# Patient Record
Sex: Female | Born: 1970 | Race: Black or African American | Hispanic: No | Marital: Single
Health system: Midwestern US, Academic
[De-identification: ages and names within clinical notes are randomized; demographics above are authoritative.]

## PROBLEM LIST (undated history)

## (undated) HISTORY — PX: CHOLECYSTECTOMY: SHX55

---

## 2007-11-01 ENCOUNTER — Inpatient Hospital Stay: Primary: Student in an Organized Health Care Education/Training Program

## 2007-12-24 ENCOUNTER — Inpatient Hospital Stay: Primary: Student in an Organized Health Care Education/Training Program

## 2008-03-20 NOTE — Unmapped (Signed)
Signed by Ranae Plumber MD on 06/28/2008 at 10:25:47  Pap Smear      Imported By: Emilie Rutter MA 06/24/2008 13:06:54    _____________________________________________________________________    External Attachment:    Please see [facility] EMR for this document.

## 2008-05-17 ENCOUNTER — Inpatient Hospital Stay: Primary: Student in an Organized Health Care Education/Training Program

## 2008-05-17 LAB — OFFICE VISIT LAB RESULTS: KOH Prep: POSITIVE

## 2008-05-17 NOTE — Unmapped (Signed)
Signed by Marlow Baars [name] on 05/17/2008 at 11:55:08    Demographic Update       Reason for Visit   Chief Complaint: Annual  Pap and exam.  History from: patient    Allergies  No Known Allergies  Blood Pressure   BP #1: 142 / Hg  Cuff Size: Lg   BP #2: 140 / 100 mm Hg Cuff Size: Lg     Pain:     Have you had pain other than everyday aches and pains (e.g., mild headache, back ache, strains) in the past week?   No    Intake recorded by: April Schachleiter CMA on May 17, 2008 10:14 AM    Comments: pt sts has not been to any Dr. in 13 years.             History of Present Illness   Age of First Menses: 13  LMP: 05/08/2008  History of Abnormal Paps: No  G: 3  P 3:     # Living Children: 3  Current Birth Control: BTL  Other Birth Control: BLT 01/02/1990    c/o vag discharge and fishy odor      Annual Visit     Current Problems   Patient complains of: vaginal discharge  Patient denies: abnormal bleeding, breast problems, dysmenorrhea, pelvic pain, vulvar pain, sexual problems    Currently Sexually Active: Yes  Partners in Last 12 Months: 1 x 3 yrs  Calcium Supplement: No  Vitamin D Supplement: No  Multivitamin: No  Family History of Osteoporosis: No  Exercise: Yes  Type: walks alot  Has Patient been threatened, abused or hurt by anyone? No    Past History  Past Medical History:  Onset of First Menses at Age: 44, Gravida: 3, Para: 3, # Vaginal Deliveries: 2  Surgical History:  Cesarean Section: 1991, Tubal Ligation: 1991    Family History: Father - NIDDM  PGM - IDDM  Social History: Alcohol Use: none  Drug Use: none  Tobacco Usage:non-smoker  Sexually Active: Yes        Review of Systems  Genitourinary: Complains of vaginal discharge.     Physical Examination  Constitutional: alert, no acute distress, well hydrated, well developed, well nourished;    Skin: normal color, no rashes, no lesions, warm to touch;    Head: atraumatic, normocephalic;    Eyes: pupils equal, no injection, no icterus;    Ears/Nose/Throat:  external ears normal, hearing normal, external nose normal;    Neck: supple, no adenopathy, no masses, no thyromegaly;    Chest: no apparent respiratory distress, normal chest inspection, clear to auscultation, normal breath sounds bilaterally;    Breast:  breast symmetrical, no tenderness, no dimpling, no masses, no erythema, no axillary adenopathy, normal aerola bilaterally, no nipple discharge, no inversion;    Cardiac:  normal S1 and S2, regular rate and rhythm, no murmurs, no gallops, no rubs;    Abdomen: nondistended, nontender, no guarding, no masses, no organomegaly, no abdominal hernias, no suprapubic tenderness;    External Genitalia:  no lesions, normal estrogen effect, normal hair pattern, no clitorimegaly, no erythema, normal perineal body;    Urethra:  normal appearance, no erythema, no tenderness, normal urethral meatus;    Vagina:  no lesions, no masses, no cystocele, no rectocele, normal support, normal rugations;  discharge;  yellow malodorous.    Cervix:  present, no lesions, no discharge, no cervical motion tenderness;  friable;    Uterus:  uterus midline, normal  size, non-tender, regular contour, mobile;    Adnexa:  no masses, no tenderness;    Lymphatic: no cervical adenopathy, no supraclavicular adenopathy, no axillary adenopathy, no inguinal adenopathy, no femoral adenopathy, no enlargement;    Back: no deformities, normal spine, no cva tenderness;    Extremities: no edema, no lesions;    Neuro: normal mental status;    Psych: oriented to time; place; and person, affect and mood appropriate;        Wet Mount/KOH   Source: vaginal  WM: Clue Cells present  KOH: Positive Whiff      Assessment   37 year old Female     G: 3  P 3:     # Living Children: 3    Status of Existing Problems  Assessed ANNUAL GYNECOLOGICAL EXAMINATION as comment only - well woman annual exam  BSE monthly  Pap/DNAP done  Flagyl twice a day x 7 days for BV  Rec pt find a PCP for blood pressure f/u----pt states she'll call  [facility]  RTO 1 yr - Marlow Baars [name]    Medications   New Prescriptions/Refills:  METRONIDAZOLE 500 MG TABS (METRONIDAZOLE) one by mouth twice a day for seven days  #14 x 0, 05/17/2008, Marlow Baars [name]      Today's Orders   Cytology, Pap / HPV Reflex / GC / Chlamydia (ThinPrep) ([id number]) [[id number]]    Prescriptions:  METRONIDAZOLE 500 MG TABS (METRONIDAZOLE) one by mouth twice a day for seven days  #14 x 0   Entered and Authorized by: Marlow Baars [name]   Signed by: Marlow Baars [name] on 05/17/2008   Method used: Print then Give to Patient   RxID: [id number]            ]    NURSE EXIT NOTES   No RN exit.  Instructions per NP  ...................................................................[facility] RN  May 17, 2008 11:19 AM

## 2008-06-26 NOTE — Unmapped (Signed)
Signed by Audie Box RN on 06/26/2008 at 12:49:56      Follow-up for Test Results:   Test results are: abnormal  Comments: Positive trichomonas  Metronidazole 2 gr po x 1  Partner needs treatment  Condoms 100%  Rec. HIV/RPR/Hep B + C  No intercourse x 7 days after both are treated    Positive chlamydia culture  Zithromax 1 gr po x 1  Partner needs tx.  No intercourse x 7 days after both are treated  Condoms 100%  Rec. RPR, HIV, Hep B + C  Rec. recheck in 1-2 mths    Action taken: call patient to inform of results  Follow-up by: Marlow Baars [name],  June 26, 2008 10:25 AM    Additional Follow-up for Test Results:   Comments: Pt informed of positive chlamydia culture and trich on Pap. NKDA  metronidazole 2gram by mouth x1 and Zithromax 1 gram by mouth x1 phoned to [facility] at [phone]  Pt offerred toc in 4-6 weeks  Additional Follow-up by: Audie Box RN,  June 26, 2008 12:48 PM    New Problems:  CHLAMYDIA TRACHOMATIS, GU SITE NEC (ICD-099.54)  TRICHOMONIASIS UNSPECIFIED (ICD-131.00)  New Medications:  * METRONIDAZOLE 500 MG TABS (METRONIDAZOLE)  #4 take all by mouth x 1  ZITHROMAX 1 GM PACK (AZITHROMYCIN) take all tablets at once

## 2008-06-28 NOTE — Unmapped (Signed)
Signed by Zachery Conch RN on 06/29/2008 at 10:53:46      Follow-up for Test Results:   Comments: Has HGSIL, needs colposcopy  Action taken: call patient to schedule appointment  Follow-up by: Trula Ore MD,  June 28, 2008 10:23 AM    New Problems:  PAP SMER CERV W/HI GRADE SQUAMOUS INTRAEPITH LES (ICD-795.04)    Patient's pap from 05/17/08 was negative with evidence of trichimoniasis. Patient was treated for trich at the time of the visit. Scanning error resulted in the pap of another patient included in EMR of Alexandra [name]. Alexandra [name], Nurse Manager notified of scanning error on 06/26/08.  Zachery Conch, RNC-OB, MSN

## 2008-08-07 ENCOUNTER — Inpatient Hospital Stay: Primary: Student in an Organized Health Care Education/Training Program

## 2008-08-07 LAB — OFFICE VISIT LAB RESULTS
KOH Prep: NEGATIVE
Trichomonas Wet Prep: ABSENT

## 2008-08-07 NOTE — Unmapped (Signed)
Signed by Fredrich Birks MSW LISW-S on 08/07/2008 at 09:52:29    Pt requested bus token home as her transfer ran out.   SW informed her of [employer] transportation benefits, yet pt said they take too long.  SW stated will provide bus token today only as all future transportation is pt's responsibility.  Pt appreciative of help.

## 2008-08-07 NOTE — Unmapped (Signed)
Signed by Aretta Nip NP on 08/07/2008 at 10:10:29    Demographic Update       Reason for Visit   Chief Complaint: pt Here today for  st she has brown discharge and itching.  History from: patient    Allergies  No Known Allergies  Vital Signs  Weight: 187 lbs.     Blood Pressure   BP #1: 116 / 84mm Hg  Cuff Size: Std     Pain:     Have you had pain other than everyday aches and pains (e.g., mild headache, back ache, strains) in the past week?   No    Intake recorded by: April Schachleiter CMA on August 07, 2008 9:10 AM             History of Present Illness   Age of First Menses: 13  LMP: 07/30/2008  History of Abnormal Paps: No  Pap: Normal   Date: 05/22/2008  G: 3  P 3:     # Living Children: 3  Current Birth Control: BTL, Condoms  Other Birth Control: BLT 01/02/1990    Vulvar/Vaginitis   Duration: 14 days  Discharge: Yes  Amount of Discharge: moderate  Consistency: thin  Color: brown  Odor: Yes - odd odor  Pain: No  Skin Changes: No  Itching: Yes  Frequency: sometimes  Location: Periurethra.  Dysuria: No    Associated Symptoms:   Denies: nausea, vomitting, fever, chills, dyspareunia, dysuria, urinary frequency, urinary urgency    Sexual History:   Ever had Sex: No  Currently Sexually Active: Yes  Partners: Men  # Partners in Last Year: 1  New Partner: No  Length of Time with Partner: 3 yrs  Patient denies STD Risk  Frequency of Condom Use: Sometimes  Partner with STD: No  Partner with Symptoms: No    Exposures:   New Products: Sexual Aids              Physical Examination  External Genitalia:  no lesions, normal estrogen effect, normal hair pattern, no clitorimegaly, no erythema, normal perineal body;    Urethra:  normal appearance, no erythema, no tenderness, normal urethral meatus;    Vagina:  no discharge, no lesions, no masses, no cystocele, no rectocele, normal support, normal rugations;    Cervix:  present, no lesions, no cervical motion tenderness;  discharge from os;  brown discharge from the os.       Uterus:  uterus midline, normal size, non-tender, regular contour, mobile;    Adnexa:  no masses, no tenderness;      teaching per NP. ..................................................................Marland KitchenCherlynn June RN  August 07, 2008 10:07 AM          Wet Mount/KOH   Source: vaginal  WM: rare bacteria, none/rare WBCs, Clue Cells present, Trichomonas absent  KOH: No Hyphae, No Budding Yeast, Negative Whiff      Assessment   37 year old Female     G: 3  P 3:     # Living Children: 3    Status of Existing Problems  Assessed BACTERIAL VAGINITIS as comment only - 37 y/o vaginal discharge  brown vaginal discharge with occ itching for 2 wks  tx for chlamydia in [place]  No TOC done  wet mount and DNAP obtained  wet mount +clue cells  RX Metronidazole vaginal gel for 5 days  RTO prn - Aretta Nip NP    Medications   New Prescriptions/Refills:  METRONIDAZOLE 0.75 % VAG GEL (  METRONIDAZOLE) 1 applicator full pv at bedtime for 5 days  #1 x 0, 08/07/2008, Aretta Nip NP      Today's Orders   DNA Probe GC & Chlamydia   (DNAP)  ([id number]) [*[id number]]    Disposition:     *Patient Care Summary printed and given to patient.  Clinic: NP  Primary OB/GYN Provider: Ceasar Lund [name]  prn   Appointment Reason: GYN    Patient Education   Education was provided to: patient  Patient Response: Expressed understanding    Counseling:   HIV Prevention  STD Prevention (Condoms, Abstinence)    Prescriptions:  METRONIDAZOLE 0.75 % VAG GEL (METRONIDAZOLE) 1 applicator full pv at bedtime for 5 days  #1 x 0   Entered and Authorized by: Aretta Nip NP   Signed by: Aretta Nip NP on 08/07/2008   Method used: Print then Give to Patient   RxID: [id number]            ]

## 2008-08-07 NOTE — Unmapped (Signed)
Signed by Aretta Nip NP on 08/08/2008 at 16:28:43  Patient: Alexandra [name]  Note: All result statuses are Final unless otherwise noted.    Tests: (1) CHLAMYDIA/N.GONORRHOEAE DNA, SDA (DNAP)    Order Note: AKA:GC/CHLAMYDIA DNA PROBE:    Order Note: PERFORMED AT: [facility]    Order Note: [employer] [facility]    Order Note: [id number]    Order Note: [place], [place] [id number]    Order Note: LAB DIRECTOR: Dollene Primrose, MD   PHONE: [phone]  ! Chlamydia Trachomatis DNA, SDA                              Negative                    Negative  ! Neisseria Gonorrhoeae DNA, SDA                              Negative                    Negative  ! Please Note               [facility]      Acceptable specimens for this test are female urethral swab,      endocervical swab and liquid based pap specimens, vaginal      swabs in APTIMA transports and first void urine. See online      Directory of Services for test number for rectal and      pharyngeal specimens.    Note: An exclamation mark (!) indicates a result that was not dispersed into   the flowsheet.  Document Creation Date: 08/08/2008 4:15 PM  _______________________________________________________________________    (1) Order result status: Final  Collection or observation date-time: 08/07/2008 12:28  Requested date-time: 08/07/2008 12:28  Receipt date-time: 08/08/2008 00:57  Reported date-time: 08/08/2008 16:15  Referring Physician: Marlow Baars  Ordering Physician: Marlinda Mike ([email or web address])  Specimen Source: SB&SWAB     SWAB CTGC&SWAB(APTIMA Combo 2)RoomTemp  Source: [name] Order Number: [id number]  Lab site: [facility]      [address]      [place] [address]  [phone]

## 2009-09-25 ENCOUNTER — Inpatient Hospital Stay: Primary: Student in an Organized Health Care Education/Training Program

## 2009-09-25 NOTE — Unmapped (Signed)
Signed by Aretta Nip NP on 09/26/2009 at 07:43:43        Reason for Visit   Chief Complaint: Annual exam and pap. c/o milky discharge with odor.  History from: patient    Allergies  No Known Allergies    Vital Signs  Height: 62 in.   Method: per patient  Weight: 191 lbs.     BMI (in-lb): 35.06   BSA (m2): 1.88  Pulse rate: 76   Temperature: 98.2 degrees  F   Blood Pressure   BP #1: 120 / 74mm Hg  Cuff Size: Lg     Pain:     Have you had pain other than everyday aches and pains (e.g., mild headache, back ache, strains) in the past week?   No    Intake recorded by: April Schachleiter CMA on September 25, 2009 2:57 PM             History of Present Illness   Age of First Menses: 13  LMP: 09/03/2009  History of Abnormal Paps: No  Pap: Normal   Date: 05/19/2008  G: 3  P 3:     # Living Children: 3  Current Birth Control: BTL, Condoms  Other Birth Control: BLT 01/03/1995    Annual Visit   Character: normal  Duration of flow: 3-5 day(s)  Interval: 28 days    Current Problems   Patient denies: abnormal bleeding, breast problems, dysmenorrhea, pelvic pain, vaginal discharge, vulvar pain, sexual problems  Additional Details: vaginal discharge with odor for 3wks-milky white    Currently Sexually Active: Yes  Ever Been Sexually Active: Yes  Sexual Partners are: Men  Total Lifetime Partners: >10  Partners in Last 12 Months: 1  Calcium Supplement: No  Vitamin D Supplement: No  Multivitamin: No  History of DVT's/VTE's: No  Family History of Osteoporosis: No  Exercise: Yes  Type: walking  Has Patient been threatened, abused or hurt by anyone? No    PAST HISTORY  Past Medical History (reviewed - no changes required):  Onset of First Menses at Age: 72, Gravida: 3, Para: 3, # Vaginal Deliveries: 2  Surgical History:  Cesarean Section: 1991, Tubal Ligation: 1996, No problems with anesthesia, No problems with healing, No problems with blood clots after surgery    Family History: Father - NIDDM  PGM - IDDM  Social History: Preferred  Language: English,   Marital Status: single,   Children: 3,   Employment Status: unemployed,   Patient Lives at: 2 family home,   Support System: adequate  Exercise: walking,   Caffeine per Day: 4+  Seatbelt Use: 80 % of the time  Alcohol Use: none  Drug Use: none  Tobacco Usage:non-smoker  Sexually Active: Yes  Total # of Sex Partners: >10        Review of Systems  General: Denies any specific issues at this time.   Eyes: Denies any specific issues at this time.   Ears/Nose/Throat: Denies any specific issues at this time.   Cardiovascular: Denies any specific issues at this time.   Respiratory: Denies any specific issues at this time.   Gastrointestinal: Denies any specific issues at this time.   Genitourinary: Denies any specific issues at this time.   Musculoskeletal: Denies any specific issues at this time.   Skin: Denies any specific issues at this time.   Neurologic: Denies any specific issues at this time.   Psychiatric: Denies any specific issues at this time.   Endocrine: Denies any specific  issues at this time.   Heme/Lymphatic: Denies any specific issues at this time.   Allergic/Immunologic: Denies any specific issues at this time.     Physical Examination  Constitutional: alert, no acute distress, well hydrated, well developed, well nourished, appropriate for age;    Skin: normal color, no rashes, no lesions, warm to touch;    Head: atraumatic, normocephalic;    Eyes: pupils equal, no injection, no icterus;    Ears/Nose/Throat: external ears normal, hearing normal, external nose normal;    Neck: supple, no adenopathy, no masses, no thyromegaly;    Chest: no apparent respiratory distress, normal chest inspection, clear to auscultation, normal breath sounds bilaterally;    Breast:  breast symmetrical, no tenderness, no dimpling, no masses, no erythema, no axillary adenopathy, normal aerola bilaterally, no nipple discharge, no inversion;    Cardiac:  normal S1 and S2, regular rate and rhythm, no murmurs, no  gallops, no rubs;    Abdomen: nondistended, nontender, no guarding, no masses, no organomegaly, no abdominal hernias, no suprapubic tenderness;    External Genitalia:  no lesions, normal estrogen effect, normal hair pattern, no clitorimegaly, no erythema, normal perineal body;    Urethra:  normal appearance, no erythema, no tenderness, normal urethral meatus;    Vagina:  no lesions, no masses, no cystocele, no rectocele, normal support, normal rugations;  discharge;  thin, white, malodorous discharge.    Cervix:  present, no lesions, no discharge, no cervical motion tenderness;    Uterus:  uterus midline, normal size, non-tender, regular contour, mobile;    Adnexa:  no masses, no tenderness;    Lymphatic: no cervical adenopathy, no supraclavicular adenopathy, no axillary adenopathy, no inguinal adenopathy, no femoral adenopathy, no enlargement;    Back: no deformities, normal spine, no cva tenderness;    Extremities: no edema, no lesions;    Neuro: normal mental status;    Psych: oriented to time; place; and person, affect and mood appropriate;          Assessment   38 year old Female     G: 3  P 3:     # Living Children: 3    Status of Existing Problems  Assessed BACTERIAL VAGINITIS as comment only - No douching, sprays, powders.  Use unscented soaps, no washing in the vagina.  Use unscented pads/tampons on menses, limit panty liner use. Take showers instead of baths and wear cotton lined panties.  Metronidazole x 7days - Aretta Nip NP - Signed  Assessed ANNUAL GYNECOLOGICAL EXAMINATION as comment only - Well woman exam  pap today  enc monthly SBE  enc daily multivitamin and calcium intake  Return to office 1 yr or as needed  - Aretta Nip NP - Signed    Medications   New Prescriptions/Refills:  METRONIDAZOLE 500 MG TABS (METRONIDAZOLE) one by mouth twice a day for seven days  #14 x 0, 09/25/2009, Aretta Nip NP      Today's Orders   Cytology, Pap / HPV Reflex  (ThinPrep) ([id number]) [[id number]]    Disposition:      *Patient Care Summary printed and given to patient.    Clinic: NP  Primary OB/GYN Provider: Ceasar Lund [name]  Return to clinic for Provider Visit in 1 year(s)   Appointment Reason: Annual exam    Patient Education   Education was provided to: patient  Patient Response: Expressed understanding, Receptive    Counseling:   Calcium Intake  HIV Prevention  Menopause  Seatbelts  Self Breast Awareness  STD  Prevention (Condoms, Abstinence)  Minutes spent on education: 15    Prescriptions:  METRONIDAZOLE 500 MG TABS (METRONIDAZOLE) one by mouth twice a day for seven days  #14 x 0   Entered and Authorized by: Aretta Nip NP   Signed by: Aretta Nip NP on 09/25/2009   Method used: Handwritten   RxID: [id number]            ]       PATIENT EDUCATION  * Education was provided to: patient    NURSE EXIT NOTES   Annual exam and pap today.  Given script for Metronidazole for BV.  To return in one yr  ...................................................................[facility] RN  September 25, 2009 4:08 PM

## 2009-11-26 ENCOUNTER — Inpatient Hospital Stay: Primary: Student in an Organized Health Care Education/Training Program

## 2009-11-26 NOTE — Unmapped (Signed)
Signed by Aretta Nip NP on 11/26/2009 at 11:11:19      DEMOGRAPHIC UPDATE   Current registration information has been reviewed and is correct.  No changes are needed.      Reason for Visit   Chief Complaint: test of cure  History from: patient    Allergies  No Known Allergies    Medications          Vital Signs  Height: 62 in.   Weight: 193 lbs.     BMI (in-lb): 35.43   BSA (m2): 1.88  Pulse rate: 64   Temperature: 96.8 oral degrees  F   Blood Pressure   BP #1: 130 / 90mm Hg  Cuff Size: Std     Pain:     Have you had pain other than everyday aches and pains (e.g., mild headache, back ache, strains) in the past week?   No    Intake recorded by: Enzo Bi MA on November 26, 2009 10:46 AM    Comments: Threw up medication wants more, thinks feeling the same symptoms but doesn't know because is currently menstruating, per pt               History of Present Illness   Age of First Menses: 13  LMP: 11/22/2009  History of Abnormal Paps: No  G: 3  P 3:     # Living Children: 3  Current Birth Control: BTL, Condoms  Other Birth Control: BLT 01/03/1995  Current Hormone Therapy: no    Sts was treated for BV in Nov and threw up the last 4 pills and would like another test done.   currently on cycle                  dispostiion sheet with pt to checkout. ..................................................................Marland KitchenCherlynn June RN  November 26, 2009 11:05 AM          Assessment   39 year old Female     G: 3  P 3:     # Living Children: 3    Status of Existing Problems  Assessed BACTERIAL VAGINITIS as comment only - Pt to reschedule for 2 wks as needed for recheck for BV as pt in on her cycle - Aretta Nip NP - Signed    Disposition:     *Patient Care Summary printed and given to patient.    Clinic: NP  Primary OB/GYN Provider: Ceasar Lund [name]  Return to clinic for Provider Visit in 2 week(s)   Appointment Reason: Follow up            ]

## 2010-02-05 ENCOUNTER — Inpatient Hospital Stay: Primary: Student in an Organized Health Care Education/Training Program

## 2010-02-05 LAB — POC URINALYSIS
Bilirubin Urine: NEGATIVE
Glucose, UA: NEGATIVE g/dL
Ketones, UA: NEGATIVE
Nitrite, UA: NEGATIVE
Protein, UA: NEGATIVE
RBC, UA: NEGATIVE
Specific Gravity, UA: 1.02 (ref 1.005–1.035)
Urobilinogen, UA: 0.2 U/dL (ref 0.2–1.0)
pH, UA: 7 (ref 5.0–8.0)

## 2010-02-05 LAB — OFFICE VISIT LAB RESULTS: Trichomonas Wet Prep: ABSENT

## 2010-02-05 NOTE — Unmapped (Signed)
Signed by Meda Klinefelter CNM on 02/05/2010 at 11:02:15    Printed Handout:  - Bacterial Vaginosis

## 2010-02-05 NOTE — Unmapped (Signed)
Signed by Meda Klinefelter CNM on 02/05/2010 at 17:20:37        Reason for Visit   Chief Complaint: discharge x 2 months-same symptoms since January, pt not able to take medication, made her sick.  History from: patient    Allergies  No Known Allergies    Medications          Vital Signs  Height: 62 in.   Weight: 200 lbs.   Pulse rate: 68   Temperature: 97.2 degrees  F   Blood Pressure   BP #1: 120 / 80mm Hg  Cuff Size: Std     Pain:     Have you had pain other than everyday aches and pains (e.g., mild headache, back ache, strains) in the past week?   No    Intake recorded by: Rennis Chris on February 05, 2010 10:30 AM               History of Present Illness   Chief Complaint: BV diagnosed Jan 2011- attempted oral Flagyl and vomited- s/s improved briefly but reports they're back.  Age of First Menses: 13  LMP: 01/16/2010  History of Abnormal Paps: No  G: 3  P 3:     # Living Children: 3  Current Birth Control: BTL, Condoms  Other Birth Control: BLT 01/03/1995  Current Hormone Therapy: no    Vulvar/Vaginitis   Duration: on and off x 3 months months  Discharge: Yes  Amount of Discharge: small  Consistency: thin  Color: brown-pink  Odor: Yes - slight  Pain: No  Skin Changes: No  Itching: No  Dysuria: No    Associated Symptoms:   Denies: nausea, vomitting, fever, chills, dyspareunia, dysuria, urinary frequency, urinary urgency    Sexual History:   Currently Sexually Active: Yes  Partners: Men  # of Lifetime Partners: >10  # Partners in Last Year: 1  New Partner: No  Patient denies STD Risk  Partner with STD: No  Partner with Symptoms: No    Exposures:   New Products: None            Review of Systems  Genitourinary: Complains of see HPI.     Physical Examination  External Genitalia:  no lesions, normal estrogen effect, normal hair pattern, no clitorimegaly, no erythema, normal perineal body;    Urethra:  normal appearance, no erythema, no tenderness, normal urethral meatus;    Vagina:  no lesions, no masses, no  cystocele, no rectocele, normal support, normal rugations;  small amount thin yellowish discharge, malodorous.    Cervix:  present, no lesions, no discharge, no cervical motion tenderness;    Uterus:  uterus midline, normal size, non-tender, regular contour, mobile;    Adnexa:  no masses, no tenderness;          Wet Mount/KOH   Source: vaginal  WM: rare bacteria, none/rare WBCs, Clue Cells present, Trichomonas absent  KOH: No Hyphae, No Budding Yeast      Assessment   39 year old Female     G: 3  P 3:     # Living Children: 3    Status of Existing Problems  Assessed BACTERIAL VAGINITIS as comment only - Pt did not tolerate GI side effects with oral Flagyl- will rx MetroGel generic  Discussed appropriate vag hygiene; discouraged douching - Dainelle Hun D Jamen Loiseau CNM    Medications   New Prescriptions/Refills:  METRONIDAZOLE  0.75 % GEL (METRONIDAZOLE) one applicator full per vagina twice a day  for five days  #10 x 0, 02/05/2010, Meda Klinefelter CNM      Today's Orders   DNA Probe GC & Chlamydia   (DNAP)  (17305) [*CPT-87491/87591]    Disposition:     *Patient Care Summary printed and given to patient.    Clinic: NP  Primary OB/GYN Provider: Videtto [name]  Appointment Reason: November 2011- Needs Annual GYN exam    Prescriptions:  METRONIDAZOLE  0.75 % GEL (METRONIDAZOLE) one applicator full per vagina twice a day for five days  #10 x 0   Entered and Authorized by: Meda Klinefelter CNM   Signed by: Meda Klinefelter CNM on 02/05/2010   Method used: Handwritten   RxID: [id number]            ]       NURSE EXIT NOTES   Pt here for gyn problem.  DNAP today.  To return for annual or as needed  ...................................................................[facility] RN  February 05, 2010 11:15 AM

## 2010-02-05 NOTE — Unmapped (Signed)
Signed by Meda Klinefelter CNM on 02/06/2010 at 15:17:12  Patient: Alexandra [name]  Note: All result statuses are Final unless otherwise noted.    Tests: (1) CHLAMYDIA / GONORRHOEAE DNA SWAB (CTNGSW)  ! Chlamydia Trachomatis DNA                              Negative                    Negative      Chlamydia trachomatis Roche PCR sensitivity is 93.0%.  Specificity is   96.5%.  ! Neisseria Gonorrhoeae DNA                              Negative                    Negative      Neisseria gonorrhoeae Roche PCR sensitivity is 97.1%.  Specificity is   98.1%.    Note: An exclamation mark (!) indicates a result that was not dispersed into   the flowsheet.  Document Creation Date: 02/06/2010 2:56 PM  _______________________________________________________________________    (1) Order result status: Final  Collection or observation date-time: 02/05/2010 12:26  Requested date-time: 02/05/2010 12:26  Receipt date-time: 02/05/2010 17:43  Reported date-time: 02/06/2010 14:56  Referring Physician: Valeda Malm  Ordering Physician: Valeda Malm [facility])  Specimen Source: SB&SWAB     SWABM4T&SWAB-M4RT [employer]  Source: [name] Order Number: [id number]  Lab site: [facility]      [address]      [place] [address]  [phone]

## 2010-02-05 NOTE — Unmapped (Signed)
Signed by Meda Klinefelter CNM on 02/05/2010 at 10:53:45  Patient: Alexandra [name]  Note: All result statuses are Final unless otherwise noted.    Tests: (1) POC URINALYSIS (POCUA)    Glucose, Urine            Negative mg/dL              Negative    Bilirubin, Urine          Negative                    Negative    Ketone                    Negative mg/dL              Negative    Specific Gravity          1.020                       1.005-1.035    Blood                     Negative                    Negative    pH                        7.0                         5.0-8.0    Protein, urine            Negative mg/dL              Negative    Urobilinogen              0.2 mg/dL                   9.1-4.7    Nitrite                   Negative                    Negative    Leukocyte Esterase   [A]  Trace                       Negative    Note: An exclamation mark (!) indicates a result that was not dispersed into   the flowsheet.  Document Creation Date: 02/05/2010 10:29 AM  _______________________________________________________________________    (1) Order result status: Final  Collection or observation date-time: 02/05/2010 10:23  Requested date-time: 02/05/2010 10:23  Receipt date-time: 02/05/2010 09:45  Reported date-time: 02/05/2010 10:29  Referring Physician: Valeda Malm  Ordering Physician: Valeda Malm [facility])  Specimen Source: U&URINE     UACUP&URINALYSIS CONTAINER  Source: [name] Order Number: [id number]  Lab site: [facility]      [address]      [place] [address]  [phone]      -----------------    The following lab values were dispersed to the flowsheet  with no units conversion:      Urobilinogen, 0.2 MG/DL, (F)  expected units: E units/dL    -----------------    The following non-numeric lab results were dispersed to  the flowsheet even though numeric results were expected:      Glucose,  Urine, Negative

## 2010-03-07 ENCOUNTER — Inpatient Hospital Stay: Primary: Student in an Organized Health Care Education/Training Program

## 2010-03-07 LAB — OFFICE VISIT LAB RESULTS: Trichomonas Wet Prep: ABSENT

## 2010-03-07 NOTE — Unmapped (Signed)
Signed by Ranae Palms NP on 03/07/2010 at 16:18:46        Reason for Visit   Chief Complaint: pt having yeast and irritation, itching and odor, sts she used Metronidazole cream then got a Yeast infection. sts last used cream April 17th.  History from: patient    Allergies  No Known Allergies    Vital Signs  Height: 62 in.   Weight: 198 lbs.     BMI (in-lb): 36.35   BSA (m2): 1.91  Pulse rate: 80   Temperature: 99.0 oral degrees  F   Blood Pressure   BP #1: 136 / 88mm Hg  Cuff Size: Lg     Pain:     Have you had pain other than everyday aches and pains (e.g., mild headache, back ache, strains) in the past week?   No    Intake recorded by: April Schachleiter CMA on Mar 07, 2010 2:04 PM             Medications reviewed, updated and verified with patient or patient representative.    Screening for unhealthy alcohol use performed.  Women (Any Age) or Men (over Age 9): nondrinker      Vulvar/Vaginitis   Duration: 3 weeksDischarge: Yes  Amount of Discharge: scant  Consistency: thin  Color: white  Odor: Yes - fishy  Pain: No  Skin Changes: No  Itching: Yes  Frequency: occational  Location: Vagina.  Dysuria: No    Associated Symptoms:   Complains of: none  Denies: nausea, vomitting, fever, chills, dyspareunia, dysuria, urinary frequency, urinary urgency    Sexual History:   Ever had Sex: Yes  Currently Sexually Active: Yes  Partners: Men  Type of Sex: genital to genital  # of Lifetime Partners: >10  # Partners in Last Year: 1  New Partner: No  Length of Time with Partner: 5 months  Patient denies STD Risk  Frequency of Condom Use: Sometimes  Partner with STD: No  Partner with Symptoms: No    Exposures:   New Products: None    History of Present Illness   Chief Complaint: irritation since using Metrogel  Age of First Menses: 13  LMP: 02/16/2010  History of Abnormal Paps: No  G: 3  P 3:     # Living Children: 3  Current Birth Control: BTL, Condoms  Other Birth Control: BLT 01/03/1995  Current Hormone Therapy: no    BV  02/05/10      PAST HISTORY  Past Medical History (reviewed - no changes required):  Onset of First Menses at Age: 26, Gravida: 3, Para: 3, # Vaginal Deliveries: 2  Surgical History (reviewed - no changes required):  Cesarean Section: 1991, Tubal Ligation: 1996, No problems with anesthesia, No problems with healing, No problems with blood clots after surgery    Family History (reviewed - no changes required): Father - NIDDM  PGM - IDDM  Social History (reviewed - no changes required): Preferred Language: English,   Marital Status: single,   Children: 3,   Employment Status: unemployed,   Patient Lives at: 2 family home,   Support System: adequate  Exercise: walking,   Caffeine per Day: 4+  Seatbelt Use: 80 % of the time  Alcohol Use: none  Drug Use: none  Tobacco Usage:non-smoker  Sexually Active: Yes  Total # of Sex Partners: >10        Review of Systems  General: Complains of see HPI. Denies fevers, chills, sweats, anorexia, fatigue, malaise, weight  loss, weight gain.   Genitourinary: Complains of see HPI, vaginal discharge.     Physical Examination  Constitutional: alert, no acute distress, well hydrated, well developed, well nourished;    External Genitalia:  no lesions, normal estrogen effect, normal hair pattern, no clitorimegaly, no erythema, normal perineal body;    Urethra:  normal appearance, no erythema, no tenderness, normal urethral meatus;    Vagina:  no lesions, no masses, no cystocele, no rectocele, normal support, normal rugations;  discharge;  thin white discharge noted.    Cervix:  present, no lesions, no discharge, no cervical motion tenderness;    Uterus:  uterus midline, normal size, non-tender, regular contour, anteverted, mobile;    Adnexa:  no masses, no tenderness;      No nurse exit.  Teaching per NP.      ..................................................................Marland KitchenElvera Bicker RN  Mar 07, 2010 2:53 PM            Wet Mount/KOH   Source: vaginal  WM: rare bacteria, 1+ WBCs, Clue Cells  absent, Trichomonas absent  KOH: No Hyphae, Budding Yeast      Assessment   39 year old Female     G: 3  P 3:     # Living Children: 3    Status of Existing Problems  Assessed CANDIDIASIS OF VULVA AND VAGINA as comment only - RX Diflucan - Ranae Palms NP    Medications   New Prescriptions/Refills:  DIFLUCAN 150 MG TABS (FLUCONAZOLE) one tablet by mouth repeat in 7 days if symptoms not improved  #2 x 0, 03/07/2010, Ranae Palms NP      Disposition:     *Patient Care Summary printed and given to patient.    Clinic: NP  Primary OB/GYN Provider: Ceasar Lund [name]  Return to clinic for Provider Visit Appointment Reason: 11/11 annual    Patient Education   Education was provided to: patient  Patient Response: Expressed understanding    Counseling:   Overall Comments: no douching, no sprays/powders,use unscented soaps and do not wash up in the vagina, use unscented pads/tampons when on menses, limit panty liner use, showers instead of baths, wear cotton underwear   Minutes spent on education: 10    Prescriptions:  DIFLUCAN 150 MG TABS (FLUCONAZOLE) one tablet by mouth repeat in 7 days if symptoms not improved  #2 x 0   Entered and Authorized by: Ranae Palms NP   Signed by: Ranae Palms NP on 03/07/2010   Method used: Handwritten   RxID: [id number]            ]

## 2010-03-28 ENCOUNTER — Inpatient Hospital Stay: Primary: Student in an Organized Health Care Education/Training Program

## 2010-03-28 LAB — OFFICE VISIT LAB RESULTS: Trichomonas Wet Prep: ABSENT

## 2010-03-28 NOTE — Unmapped (Signed)
Signed by Ranae Palms NP on 03/29/2010 at 16:51:08  Patient: Alexandra [name]  Note: All result statuses are Final unless otherwise noted.    Tests: (1) CHLAMYDIA / GONORRHOEAE DNA SWAB (CTNGSW)  ! Chlamydia Trachomatis DNA                              Negative                    Negative      Chlamydia trachomatis Roche PCR sensitivity is 93.0%.  Specificity is   96.5%.  ! Neisseria Gonorrhoeae DNA                              Negative                    Negative      Neisseria gonorrhoeae Roche PCR sensitivity is 97.1%.  Specificity is   98.1%.    Note: An exclamation mark (!) indicates a result that was not dispersed into   the flowsheet.  Document Creation Date: 03/29/2010 3:34 PM  _______________________________________________________________________    (1) Order result status: Final  Collection or observation date-time: 03/28/2010 16:33  Requested date-time: 03/28/2010 16:33  Receipt date-time: 03/28/2010 17:54  Reported date-time: 03/29/2010 15:33  Referring Physician: Cindi Carbon NONSTAFF  Ordering Physician: Tacey Ruiz [facility])  Specimen Source: SB&SWAB     SWABM4T&SWAB-M4RT [employer]  Source: [name] Order Number: [id number]  Lab site: [facility]      [address]      [place] [address]  [phone]

## 2010-03-28 NOTE — Unmapped (Signed)
Signed by Ranae Palms NP on 03/29/2010 at 16:56:40  Patient: Alexandra [name]  Note: All result statuses are Final unless otherwise noted.    Tests: (1) BACTERIAL VAGINOSIS PANEL (BVVDNA)  ! Clinical Report           FINAL Report      Specimen: GENITAL SWAB  Collected: 03/28/2010 16:33     Status: Final      Last Updated: 03/29/2010 16:26           (1) Normal Range: None Detected             Candida Species (Final)      Not Detected       Gardnerella vag (Final)      Detected       Trichomonas vag (Final)      Detected      Performing Site: [facility] [address] [place] [address].      LCA [id number] #:[id number].          Note: An exclamation mark (!) indicates a result that was not dispersed into   the flowsheet.  Document Creation Date: 03/29/2010 4:27 PM  _______________________________________________________________________    (1) Order result status: Final  Collection or observation date-time: 03/28/2010 16:33  Requested date-time: 03/28/2010 16:33  Receipt date-time: 03/28/2010 22:13  Reported date-time: 03/29/2010 16:26  Referring Physician: Cindi Carbon NONSTAFF  Ordering Physician: Tacey Ruiz (GEIGEL)  Specimen Source: GS&GENITAL SWAB     AFFIRM&AFFIRM VPIII ATTS COLLECTION   DEVICE  Source: [name] Order Number: [id number]  Lab site: [facility]      [address]      [place] [address]  [phone]

## 2010-03-28 NOTE — Unmapped (Signed)
Signed by Ranae Palms NP on 03/28/2010 at 16:57:54        Reason for Visit   Chief Complaint: RGY-f/u for yeast infection, continued irritation  History from: patient    Allergies  No Known Allergies    Medications  DIFLUCAN 150 MG TABS (FLUCONAZOLE) one tablet by mouth repeat in 7 days if symptoms not improved          Vital Signs  Height: 62 in.   Weight: 200 lbs.     BMI (in-lb): 36.71   BSA (m2): 1.91  Pulse rate: 72   Temperature: 98.6 degrees  F   Blood Pressure   BP #1: 128 / 64mm Hg  Cuff Size: Std     Pain:     Have you had pain other than everyday aches and pains (e.g., mild headache, back ache, strains) in the past week?   No    Intake recorded by: Rennis Chris on Mar 28, 2010 3:16 PM             Medications reviewed, updated and verified with patient or patient representative.    Screening for unhealthy alcohol use performed.  Previous Screening:   Screening for unhealthy alcohol use performed. (03/07/2010 2:01:43 PM)  Women (Any Age) or Men (over Age 39): nondrinker      Vulvar/Vaginitis   Duration: 20 days  Discharge: Yes  Amount of Discharge: copious  Consistency: thin  Color: white  Odor: No  Pain: No  Skin Changes: Yes  bumps  Itching: Yes  Location: Vagina.  Dysuria: No    Associated Symptoms:   Complains of: none  Denies: nausea, vomitting, fever, chills, dyspareunia, dysuria, urinary frequency, urinary urgency    Sexual History:   Ever had Sex: Yes  Currently Sexually Active: Yes  Partners: Men  Type of Sex: genital to genital  # of Lifetime Partners: >10  # Partners in Last Year: 1  New Partner: No  Length of Time with Partner: 5 years  Patient denies STD Risk  Frequency of Condom Use: Sometimes  Partner with STD: No  Partner with Symptoms: No    Exposures:   New Products: None    History of Present Illness   Chief Complaint: ? yeast still from 03/07/10  Age of First Menses: 13  History of Abnormal Paps: No  G: 3  P 3:     # Living Children: 3  Current Birth Control: BTL, Condoms  Other  Birth Control: BLT 01/03/1995  Current Hormone Therapy: no    BV 02/05/10  yeast 03/07/10      PAST HISTORY  Past Medical History (reviewed - no changes required):  Onset of First Menses at Age: 35, Gravida: 3, Para: 3, # Vaginal Deliveries: 2  Surgical History (reviewed - no changes required):  Cesarean Section: 1991, Tubal Ligation: 1996, No problems with anesthesia, No problems with healing, No problems with blood clots after surgery    Family History (reviewed - no changes required): Father - NIDDM  PGM - IDDM  Social History (reviewed - no changes required): Preferred Language: English,   Marital Status: single,   Children: 3,   Employment Status: unemployed,   Patient Lives at: 2 family home,   Support System: adequate  Exercise: walking,   Caffeine per Day: 4+  Seatbelt Use: 80 % of the time  Alcohol Use: none  Drug Use: none  Tobacco Usage:non-smoker  Sexually Active: Yes  Total # of Sex Partners: >10  Review of Systems  General: Complains of see HPI. Denies fevers, chills, sweats, anorexia, fatigue, malaise, weight loss, weight gain.   Genitourinary: Complains of see HPI, vaginal discharge. bumps  Pt states that she has had them before, and saw [facility] provider who busted them  itching    Physical Examination  Constitutional: alert, no acute distress, well hydrated, well developed, well nourished;  obese appearing;    External Genitalia:  no lesions, normal estrogen effect, normal hair pattern, no clitorimegaly, no erythema, normal perineal body;  sebaceous cyst;  cluster(3-4) of sebaceous cysts upper left labia minora and 2 at introitis; size =2-4 mm.    Urethra:  normal appearance, no erythema, no tenderness, normal urethral meatus;    Vagina:  no lesions, no masses, no cystocele, no rectocele, normal support, normal rugations;  discharge;  thin white discharge noted.    Cervix:  present, no lesions, no discharge, no cervical motion tenderness;    Uterus:  uterus midline, normal size, non-tender, regular  contour, anteverted, mobile;    Adnexa:  no masses, no tenderness;        No nurse exit.   Teaching per NP.     ..................................................................Marland KitchenElvera Bicker RN  Mar 28, 2010 4:22 PM          Wet Mount/KOH   Source: vaginal  WM: 1+ bacteria, 2+ WBCs, Clue Cells present, Trichomonas absent  KOH: No Hyphae, Budding Yeast, Positive Whiff      Assessment   39 year old Female     G: 3  P 3:     # Living Children: 3    Status of Existing Problems  Assessed SCREENING EXAMINATION FOR VENEREAL DISEASE as comment only - DNAP sent; await results - Ranae Palms NP - Signed  Assessed CANDIDIASIS OF VULVA AND VAGINA as comment only - RX Terazol 3 - Ranae Palms NP - Signed  Assessed BACTERIAL VAGINITIS as comment only - no douching, no sprays/powders,use unscented soaps and do not wash up in the vagina, use unscented pads/tampons when on menses, limit panty liner use, showers instead of baths, wear cotton underwear   RX Flagyl  Affirm sent ;await results - Ranae Palms NP - Signed  Assessed SEBACEOUS CYST as comment only - RTC if they become tender or infected  Warm compresses and discussed keeping area clean and dry - Ranae Palms NP - Signed    Medications   New Prescriptions/Refills:  FLAGYL 500 MG TABS (METRONIDAZOLE) Take one tablet by mouth twice a day for  7 days  #14 x 0, 03/28/2010, Ranae Palms NP  TERAZOL 3   CREA (TERCONAZOLE CREA) one applicator full per vagina at bedtime for 3 days  #3 x 0, 03/28/2010, Ranae Palms NP      Today's Orders   DNA Probe GC & Chlamydia   (DNAP)  (17305) [*CPT-87491/87591]  AFFIRM Bacterial Vaginosis/Vaginitis Panlel (14577) [*CPT-87480/87510]    Disposition:     *Patient Care Summary printed and given to patient.    Clinic: NP  Primary OB/GYN Provider: Videtto [name]  Appointment Reason: 11/11 annual    Patient Education   Education was provided to: patient  Patient Response: Expressed understanding    Counseling:   STD  Prevention (Condoms, Abstinence)  Overall Comments: no douching, no sprays/powders,use unscented soaps and do not wash up in the vagina, use unscented pads/tampons when on menses, limit panty liner use, showers instead of baths, wear cotton underwear   Minutes spent on  education: 10    Prescriptions:  FLAGYL 500 MG TABS (METRONIDAZOLE) Take one tablet by mouth twice a day for  7 days  #14 x 0   Entered and Authorized by: Ranae Palms NP   Signed by: Ranae Palms NP on 03/28/2010   Method used: Handwritten   RxID: [id number]  TERAZOL 3   CREA (TERCONAZOLE CREA) one applicator full per vagina at bedtime for 3 days  #3 x 0   Entered and Authorized by: Ranae Palms NP   Signed by: Ranae Palms NP on 03/28/2010   Method used: Handwritten   RxID: [id number]            ]

## 2010-03-29 NOTE — Unmapped (Signed)
Signed by Janetta Hora RN on 04/02/2010 at 09:51:25      Follow-up for Test Results:   Test results are: abnormal  Comments: Postive trichomonas  Please call and inform and call in RX  RX Flagyl  Inform of GI effects  No sex for 7 days after treatment  Condoms 100%   Partner(s) need to be treated  TOC in 4-6 weeks     +BV  Follow-up by: Ranae Palms NP,  Mar 29, 2010 4:54 PM    Additional Follow-up for Test Results:   Comments: vm left. ..................................................................Marland KitchenCherlynn June RN  Apr 02, 2010 9:21 AM      Additional Follow-up for Test Results:   Comments: Pt returned our call.  Results given.  Patient informed that partner needs treatment.  No intercourse for 7 days.  Condom use recommended thereafter.  TOC discussed.  All questions answered.  Script called to [facility] at [phone].    Additional Follow-up by: Janetta Hora RN,  Apr 02, 2010 9:51 AM    New Medications:  FLAGYL 500 MG TABS (METRONIDAZOLE) Take one tablet by mouth twice a day for 7 days

## 2010-05-03 ENCOUNTER — Inpatient Hospital Stay: Primary: Student in an Organized Health Care Education/Training Program

## 2010-05-03 LAB — OFFICE VISIT LAB RESULTS: Trichomonas Wet Prep: ABSENT

## 2010-05-03 NOTE — Unmapped (Signed)
Signed by Arvil Chaco [name] on 05/08/2010 at 08:32:39  Patient: Alexandra [name]  Note: All result statuses are Final unless otherwise noted.    Tests: (1) CHLAMYDIA / GONORRHOEAE DNA SWAB (CTNGSW)  ! Chlamydia Trachomatis DNA                              Negative                    Negative      Chlamydia trachomatis Roche PCR sensitivity is 93.0%.  Specificity is   96.5%.  ! Neisseria Gonorrhoeae DNA                              Negative                    Negative      Neisseria gonorrhoeae Roche PCR sensitivity is 97.1%.  Specificity is   98.1%.    Note: An exclamation mark (!) indicates a result that was not dispersed into   the flowsheet.  Document Creation Date: 05/07/2010 3:29 PM  _______________________________________________________________________    (1) Order result status: Final  Collection or observation date-time: 05/03/2010 12:45  Requested date-time: 05/03/2010 12:45  Receipt date-time: 05/03/2010 14:13  Reported date-time: 05/07/2010 15:28  Referring Physician: Royetta Asal  Ordering Physician: Royetta Asal [facility])  Specimen Source: SB&SWAB     SWABM4T&SWAB-M4RT [employer]  Source: [name] Order Number: [id number]  Lab site: [facility]      [address]      [place] [address]  [phone]

## 2010-05-03 NOTE — Unmapped (Signed)
Signed by Arvil Chaco [name] on 05/03/2010 at 12:27:58        Reason for Visit   Chief Complaint: Wants TOC from visit in May. also c/o itching.  History from: patient    Allergies  No Known Allergies    Vital Signs  Height: 62 in.   Weight: 197 lbs.     BMI (in-lb): 36.16   BSA (m2): 1.90  Pulse rate: 88   Temperature: 98.6 oral degrees  F   Blood Pressure   BP #1: 138 / 82mm Hg  Cuff Size: Std     Pain:     Have you had pain other than everyday aches and pains (e.g., mild headache, back ache, strains) in the past week?   No    Intake recorded by: April Schachleiter CMA on May 03, 2010 11:17 AM     Communication Sensory   Primary Language English     Functional Screen/Fall Risk Assessment   In the past two months, patient has experienced:    1.  A decreased ability to walk, turn in bed, get in/out of a chair? No  2.  Decreased ability to care for self, perform routine tasks? No  3.  Recent problem with coordination/movement or loss of balance? No  4.  Use of ambulation device such as walker/cane or crutches? No  5.  Weakness, dizziness, shortness of breath, fatigue with activity? No  6.  Recent frequent history of falling? No     Nutritional Screen   1.  Do you eat a special diet or meal plan? No2.  Have you had a recent weight gain or loss of 10 lbs (in the past 2 months?) No  3.  Do you have a difficulty eating, chewing, swallowing or speaking? No     Emotional Psychosocial Spiritual   1.  Do you have any spiritual, religious or cultural rituals that we need to be aware of? No  2.  Patient feels:  No problem  3.  Do you have current, recent thoughts that you would be better off dead or of hurting yourself? No  4.  Do you have a mental health provider, case manager or payee? No  5.  Patient currently receives:  None.  6.  Do you have adequate resources/medications? Yes  7.  Recent loss:No problem.  8.  Household: With family      Abuse/Neglect   Due to the increase in domestic violence, we ask all patients:   1.  Have you recently been threatened, frightened, mistreated, hurt or hit by anyone in your life? No  2.  Have you had money or other items taken from you without your permission? No  3.  If yes, to any of the above, do you want help? No     Wellness   Have you had the following immunizations/vaccinations? (check all that apply)    Tetanus or TDAP (within last 10 yrs?) mo/yr: 2006     Educational Knowledge   Understanding of current health problems: Knowledgeable  Questionnaire completed: 05/03/2010    Signed by:  April Schachleiter CMA on May 03, 2010 11:19 AM    Medications reviewed, updated and verified with patient or patient representative.    Screening for unhealthy alcohol use performed.  Previous Screening:   Screening for unhealthy alcohol use performed. (03/28/2010 3:15:42 PM)  Women (Any Age) or Men (over Age 90): nondrinker    History of Present Illness   Chief Complaint: Pt.  here for test of reinfection  Age of First Menses: 13  LMP: 04/12/2010  History of Abnormal Paps: No  Pap: Normal   Date: 09/25/2009  G: 3  P 3:     # Living Children: 3  Current Birth Control: BTL, Condoms  Other Birth Control: BLT 01/03/1995  Current Hormone Therapy: no    On 03/29/10 was dx with BV and trich.  Pt. used metro-gel x 5 nights but did not take the by mouth flagyl because she has difficulty swallowing pills and thought the gel would tx wth the trich as well.  Still having some itching. Desires GC/CT testing done today, 03/28/10= negative.              Physical Examination  Constitutional: alert, no acute distress, well hydrated, well developed, well nourished;    External Genitalia:  no lesions, normal estrogen effect, normal hair pattern, no clitorimegaly, no erythema, normal perineal body;    Urethra:  normal appearance, no erythema, no tenderness, normal urethral meatus;    Vagina:  no discharge, no lesions, no masses, no cystocele, no rectocele, normal support, normal rugations;    Cervix:  present, no lesions, no  discharge;    Neuro: normal mental status;    Psych: oriented to time; place; and person, affect and mood appropriate;          Wet Mount/KOH   Source: vaginal  WM: 1+ bacteria, none/rare WBCs, Clue Cells present, Trichomonas absent  KOH: No Hyphae, No Budding Yeast      Assessment   39 year old Female     G: 3  P 3:     # Living Children: 3    Status of Existing Problems  Assessed History of  TRICHOMONIASIS UNSPECIFIED as comment only - no trich seen on wet mount today, explained only screening and roughly 60% accurate  advised since did not take pills, will still need to take flagyl 2 grams by mouth x 1  no contact with partner anymore  no intercourse x 7days - Darion Juhasz A Joren Rehm [name] - Signed  Assessed BACTERIAL VAGINITIS as comment only - can't take pills very well  Rx given for metro-gel  no douching, no sprays or powders, use unscented soaps and do not wash up in vagina, use unscented pads and tampons when on menses, limit pantiliner use, take showers instead of baths, wear cotton underwear - Dandrae Kustra A Avrianna Smart [name] - Signed  Assessed SCREENING EXAMINATION FOR VENEREAL DISEASE as comment only - DNAP done  RTC in 4 weeks for test of reinfection - Montoya Brandel A Obaloluwa Delatte [name] - Signed    Medications   New Prescriptions/Refills:  FLAGYL 500 MG TABS (METRONIDAZOLE) take 4 pills by mouth in a single dose  #4 x 0, 05/03/2010, Tekia Waterbury A Charita Lindenberger [name]  METROGEL-VAGINAL 0.75 % GEL (METRONIDAZOLE) one applicator full per vagina at night for five days  #1 tube x 0, 05/03/2010, Marshea Wisher A Marciel Offenberger [name]      Today's Orders   DNA Probe GC & Chlamydia   (DNAP)  (17305) [*[id number]/87591]    Disposition:     Clinic: NP  Primary OB/GYN Provider: Videtto [name]  Return to clinic for Provider Visit in 4 week(s)   Appointment Reason: follow up    Patient Education   Education was provided to: patient  Patient Response: Expressed understanding    Counseling:   STD Prevention (Condoms, Abstinence)  Minutes spent on education: 10    Prescriptions:  FLAGYL  500 MG TABS (  METRONIDAZOLE) take 4 pills by mouth in a single dose  #4 x 0   Entered and Authorized by: Arvil Chaco [name]   Signed by: Oris Calmes A Eiman Maret [name] on 05/03/2010   Method used: Handwritten   RxID: [id number]  METROGEL-VAGINAL 0.75 % GEL (METRONIDAZOLE) one applicator full per vagina at night for five days  #1 tube x 0   Entered and Authorized by: Arvil Chaco [name]   Signed by: Amariyon Maynes A Lauraann Missey [name] on 05/03/2010   Method used: Handwritten   RxID: [email or web address]            ]       PATIENT EDUCATION  * Education was provided to: patient    NURSE EXIT NOTES   Pt here for gyn visit.  See NP note.  DNAP today.  To return in 4 weeks  ...................................................................[facility] RN  May 03, 2010 11:55 AM

## 2010-07-16 NOTE — Unmapped (Signed)
Signed by Audie Box RN on 07/16/2010 at 13:30:43    PHONE NOTE          OBGYN - Telephone Triage:   Patient is: Not Pregnant      Other:   Pt called c/o brownish vaginal discharge with fishy odor.  Denies itching or burning.  Requests Metrogel  rx for BV. Is unable to swallow pills.  Pharmacy - [facility] [phone].  Pts phone number - [phone].  Please advise...................................................................Marland KitchenLaverda Sorenson RN  July 16, 2010 12:00 PM        Follow-up for Phone Call   Pt. needs appt. for evaluation  Follow-up by: Nolon Lennert [name],  July 16, 2010 12:42 PM    Additional Follow-up for Phone Call   pt informed that she needs to schedule appt.  Transferred to scheduling  Additional Follow-up by: Audie Box RN,  July 16, 2010 1:30 PM

## 2010-10-25 ENCOUNTER — Inpatient Hospital Stay: Primary: Student in an Organized Health Care Education/Training Program

## 2010-10-25 LAB — OFFICE VISIT LAB RESULTS: Trichomonas Wet Prep: ABSENT

## 2010-10-25 NOTE — Unmapped (Signed)
Signed by Ranae Palms NP on 10/29/2010 at 07:42:29  Patient: Alexandra [name]  Note: All result statuses are Final unless otherwise noted.    Tests: (1) BACTERIAL VAGINOSIS PANEL (BVVDNA)    Order Note: AFFIRM    Order Note: Normal Range: None Detected  ! Clinical Report           Result Below...        RESULT: Specimen: GENITAL SWAB  Collected: 10/25/2010 16:00     Status: Final      Last Updated: 10/27/2010 09:34           (1) AFFIRM      Normal Range: None Detected             CANDISP (Final)      Not Detected      Performing Site: [facility] [address] [place] [address].      LCA [id number] #:[id number].       GARDVAG (Final)      Detected       TRICHVAG (Final)      Detected    Note: An exclamation mark (!) indicates a result that was not dispersed into   the flowsheet.  Document Creation Date: 10/27/2010 9:34 AM  _______________________________________________________________________    (1) Order result status: Final  Collection or observation date-time: 10/25/2010 16:00  Requested date-time: 10/25/2010 16:00  Receipt date-time: 10/26/2010 03:49  Reported date-time: 10/27/2010 09:34  Referring Physician: Tacey Ruiz  Ordering Physician: Tacey Ruiz (GEIGEL)  Specimen Source: GS&GENITAL SWAB     AFFIRM&AFFIRM VPIII ATTS COLLECTION   DEVICE  Source: [name] Order Number: [id number]  Lab site: [facility]      [address]      [place] [address]  [phone]

## 2010-10-25 NOTE — Unmapped (Signed)
Signed by Ranae Palms NP on 10/29/2010 at 07:37:29  Patient: Alexandra [name]  Note: All result statuses are Final unless otherwise noted.    Tests: (1) CHLAMYDIA / GONORRHOEAE DNA SWAB (CTNGSW)  ! Chlamydia Trachomatis DNA                              Negative                    Negative      Chlamydia trachomatis Roche PCR sensitivity is 93.0%.  Specificity is   96.5%.  The test methology for CT/NG is a FDA approved DNA amplification   Assay.  ! Neisseria Gonorrhoeae DNA                              Negative                    Negative      Neisseria gonorrhoeae Roche PCR sensitivity is 97.1%.  Specificity is   98.1%.    Note: An exclamation mark (!) indicates a result that was not dispersed into   the flowsheet.  Document Creation Date: 10/28/2010 2:58 PM  _______________________________________________________________________    (1) Order result status: Final  Collection or observation date-time: 10/25/2010 16:00  Requested date-time: 10/25/2010 16:00  Receipt date-time: 10/25/2010 17:09  Reported date-time: 10/28/2010 14:58  Referring Physician: Tacey Ruiz  Ordering Physician: Tacey Ruiz [facility])  Specimen Source: SB&SWAB     SWABM4T&SWAB-M4RT [employer]  Source: [name] Order Number: [id number]  Lab site: [facility]      [address]      [place] [address]  [phone]

## 2010-10-25 NOTE — Unmapped (Signed)
Signed by Ranae Palms NP on 10/25/2010 at 14:06:45        Reason for Visit   Chief Complaint: per pt possible yeast infection, has some iching and irration  History from: patient    Allergies  No Known Allergies    Vital Signs  Height: 62 in.   Weight: 203 lbs.     BMI (in-lb): 37.26   BSA (m2): 1.93  Pulse rate: 80   Temperature: 97.8 degrees  F   Blood Pressure   BP #1: 130 / 88mm Hg  Cuff Size: Lg     Pain:     Have you had pain other than everyday aches and pains (e.g., mild headache, back ache, strains) in the past week?   No    Intake recorded by: Octaviano Batty RMA on October 25, 2010 1:09 PM             Medications reviewed, updated and verified with patient or patient representative.  Previous Screening:   Screening for unhealthy alcohol use performed. (05/03/2010 11:15:56 AM)      Vulvar/Vaginitis   Duration: 1 weekDischarge: Yes  Amount of Discharge: copious  Consistency: thin  Color: white  Odor: No  Pain: No  Skin Changes: No  Itching: Yes  Location: Labia majus, labia minus.  Dysuria: No    Associated Symptoms:   Complains of: none  Denies: nausea, vomitting, fever, chills, dyspareunia, dysuria, urinary frequency, urinary urgency    Sexual History:   Ever had Sex: Yes  Currently Sexually Active: Yes  Partners: Men  Type of Sex: genital to genital  # of Lifetime Partners: >10  # Partners in Last Year: 1  New Partner: No  Length of Time with Partner: 4 months  Patient denies STD Risk  Partner with STD: No  Partner with Symptoms: No    Exposures:   New Products: None    History of Present Illness   Chief Complaint: ?yeast infection  Age of First Menses: 13  LMP: 10/13/2010  History of Abnormal Paps: No  G: 3  P 3:     # Living Children: 3  Current Birth Control: BTL, Condoms  Other Birth Control: BLT 01/03/1995  Current Hormone Therapy: no          Review of Systems  General: Complains of see HPI. Denies fevers, chills, sweats, anorexia, fatigue, malaise, weight loss, weight gain.      Genitourinary: Complains of see HPI, vaginal discharge. Denies incontinence, day time wetting, dysuria, hematuria, urinary frequency, amenorrhea, menorrhagia, abnormal vaginal bleeding, pelvic pain, nocturia, flank pain, weak stream, precipitance, incomplete empty, retention, enuresis, perineal rash, tea-colored urine.     Physical Examination  Constitutional: alert, no acute distress, well hydrated, well developed, well nourished;  obese appearing;    External Genitalia:  no lesions, normal estrogen effect, normal hair pattern, no clitorimegaly, no erythema, normal perineal body;    Urethra:  normal appearance, no erythema, no tenderness, normal urethral meatus;    Vagina:  no lesions, no masses, no cystocele, no rectocele, normal support, normal rugations;  discharge;  small amount of thin white maloderous discharge noted.    Cervix:  present, no lesions, no discharge, no cervical motion tenderness;    Uterus:  uterus midline, normal size, non-tender, regular contour, anteverted, mobile;    Adnexa:  no masses, no tenderness;        Wet Mount/KOH   Source: vaginal  WM: 2+ bacteria, 1+ WBCs, Clue Cells present, Trichomonas absent  KOH: No Hyphae, No Budding Yeast, Positive Whiff        Assessment   39 year old Female     G: 3  P 3:     # Living Children: 3    Status of Existing Problems  Assessed SCREENING EXAMINATION FOR VENEREAL DISEASE as comment only - DNAP and affirm sent; await results  wet mount +BV - Ranae Palms NP - Signed  Assessed BACTERIAL VAGINITIS as comment only - no douching, no sprays/powders,use unscented soaps and do not wash up in the vagina, use unscented pads/tampons when on menses, limit panty liner use, showers instead of baths, wear cotton underwear   Discussed discontinuing use of fire and Ice condoms and buying new vibrators and cleaning them with appropriate cleaner  RX metrogel twice weekly for 3 months d/t chronic BV  RTC first available for annual - Ranae Palms NP -  Signed    Medications   New Prescriptions/Refills:  METROGEL-VAGINAL 0.75 % GEL (METRONIDAZOLE) Place PV twice weekly for 3 months  #8 x 2, 10/25/2010, Ranae Palms NP      Today's Orders   DNA Probe GC & Chlamydia   (DNAP)  (17305) [*CPT-87491/87591]  AFFIRM Bacterial Vaginosis/Vaginitis Panlel (14577) [*CPT-87480/87510]    Disposition:     *Patient Care Summary printed and given to patient.    Clinic: NP  Primary OB/GYN Provider: Videtto [name]  Return to clinic for Provider Visit next available   Appointment Reason: annual    Patient Education   Education was provided to: patient  Patient Response: Expressed understanding    Counseling:   STD Prevention (Condoms, Abstinence)  Overall Comments: no douching, no sprays/powders,use unscented soaps and do not wash up in the vagina, use unscented pads/tampons when on menses, limit panty liner use, showers instead of baths, wear cotton underwear   Minutes spent on education: 10    Prescriptions:  METROGEL-VAGINAL 0.75 % GEL (METRONIDAZOLE) Place PV twice weekly for 3 months  #8 x 2   Entered and Authorized by: Ranae Palms NP   Signed by: Ranae Palms NP on 10/25/2010   Method used: Handwritten   RxID: [id number]                     PATIENT EDUCATION  * Education was provided to: patient    NURSE EXIT NOTES   Pt here for gyn problem visit.  DNAP and Affirm today.  To return for annual exam  ...................................................................[facility] RN  October 25, 2010 1:51 PM

## 2010-10-29 NOTE — Unmapped (Signed)
Signed by Jessy Oto RN on 10/29/2010 at 10:59:04      Follow-up for Test Results:   Test results are: abnormal  Comments: Postive trichomonas  RX Flagyl  Inform of GI effects  No sex for 7 days after treatment  Condoms 100%   Partner(s) need to be treated  TOC in 4-6 weeks if requested    Pt taking Metrogel for chronic BV; this does not treat the trich and needs by mouth Flagyl. Please inform pt and explain above. thanks.  Follow-up by: Ranae Palms NP,  October 29, 2010 7:39 AM    Additional Follow-up for Test Results:   Action Taken: Phone Call Completed- patient informed of results, Rx Called In  Additional Follow-up by: Jessy Oto RN,  October 29, 2010 10:58 AM    New Medications:  FLAGYL 500 MG TABS (METRONIDAZOLE) Take 4 tablets in single dose

## 2011-09-02 ENCOUNTER — Ambulatory Visit: Payer: PRIVATE HEALTH INSURANCE | Primary: Student in an Organized Health Care Education/Training Program

## 2011-11-10 ENCOUNTER — Ambulatory Visit
Payer: PRIVATE HEALTH INSURANCE | Attending: Women's Health | Primary: Student in an Organized Health Care Education/Training Program

## 2011-12-22 ENCOUNTER — Ambulatory Visit
Payer: PRIVATE HEALTH INSURANCE | Attending: Women's Health | Primary: Student in an Organized Health Care Education/Training Program

## 2012-02-11 ENCOUNTER — Ambulatory Visit
Payer: PRIVATE HEALTH INSURANCE | Attending: Women's Health | Primary: Student in an Organized Health Care Education/Training Program

## 2012-03-10 ENCOUNTER — Ambulatory Visit
Payer: PRIVATE HEALTH INSURANCE | Attending: Women's Health | Primary: Student in an Organized Health Care Education/Training Program

## 2012-09-03 ENCOUNTER — Encounter
Payer: PRIVATE HEALTH INSURANCE | Attending: Foot & Ankle Surgery | Primary: Student in an Organized Health Care Education/Training Program

## 2012-09-07 ENCOUNTER — Encounter
Payer: PRIVATE HEALTH INSURANCE | Attending: Foot & Ankle Surgery | Primary: Student in an Organized Health Care Education/Training Program

## 2012-12-01 ENCOUNTER — Ambulatory Visit: Payer: PRIVATE HEALTH INSURANCE | Primary: Student in an Organized Health Care Education/Training Program

## 2013-01-05 ENCOUNTER — Ambulatory Visit: Payer: PRIVATE HEALTH INSURANCE | Primary: Student in an Organized Health Care Education/Training Program

## 2013-01-24 ENCOUNTER — Encounter: Payer: Self-pay | Admitting: Emergency Medicine

## 2013-01-24 ENCOUNTER — Emergency Department
Admission: EM | Admit: 2013-01-24 | Discharge: 2013-01-24 | Disposition: A | Discharge status: Home / Self Care | Payer: BC Managed Care – PPO | Source: Home / Self Care | Attending: Family Medicine | Admitting: Family Medicine

## 2013-01-24 ENCOUNTER — Emergency Department (INDEPENDENT_AMBULATORY_CARE_PROVIDER_SITE_OTHER): Payer: BC Managed Care – PPO

## 2013-01-24 ENCOUNTER — Ambulatory Visit (INDEPENDENT_AMBULATORY_CARE_PROVIDER_SITE_OTHER): Payer: BC Managed Care – PPO | Admitting: Sports Medicine

## 2013-01-24 DIAGNOSIS — S92919A Unspecified fracture of unspecified toe(s), initial encounter for closed fracture: Secondary | ICD-10-CM

## 2013-01-24 DIAGNOSIS — S92912A Unspecified fracture of left toe(s), initial encounter for closed fracture: Secondary | ICD-10-CM

## 2013-01-24 DIAGNOSIS — S92502A Displaced unspecified fracture of left lesser toe(s), initial encounter for closed fracture: Secondary | ICD-10-CM | POA: Insufficient documentation

## 2013-01-24 DIAGNOSIS — W2203XA Walked into furniture, initial encounter: Secondary | ICD-10-CM

## 2013-01-24 MED ORDER — TRAMADOL HCL 50 MG PO TABS: 50.0000 mg | ORAL_TABLET | Freq: Three times a day (TID) | ORAL | Status: AC | PRN

## 2013-01-24 NOTE — ED Notes (Signed)
Left 2nd toe injury last night, ran into the couch. Swollen and bruised

## 2013-01-24 NOTE — Progress Notes (Signed)
  Subjective:    I'm seeing this patient as a consultation for:  Dr. Alvester Morin  CC: Toe pain  HPI: This pleasant 42 year old female stubbed her toe yesterday, she had pain and bruising that she localizes at the middle phalanx of the left third toe. There is minimal bruising, pain is moderate, doesn't radiate. She tried some firm soled shoes which provided her with some relief.  Past medical history, Surgical history, Family history not pertinant except as noted below, Social history, Allergies, and medications have been entered into the medical record, reviewed, and no changes needed.   Review of Systems: No headache, visual changes, nausea, vomiting, diarrhea, constipation, dizziness, abdominal pain, skin rash, fevers, chills, night sweats, weight loss, swollen lymph nodes, body aches, joint swelling, muscle aches, chest pain, shortness of breath, mood changes, visual or auditory hallucinations.   Objective:   General: Well Developed, well nourished, and in no acute distress.  Neuro/Psych: Alert and oriented x3, extra-ocular muscles intact, able to move all 4 extremities, sensation grossly intact. Skin: Warm and dry, no rashes noted.  Respiratory: Not using accessory muscles, speaking in full sentences, trachea midline.  Cardiovascular: Pulses palpable, no extremity edema. Abdomen: Does not appear distended. Toe: Painful and swollen on the left at the base of the third middle phalanx.  X-rays were reviewed and show a small avulsion fracture at the medial base of the left third middle phalanx.  Impression and Recommendations:   This case required medical decision making of moderate complexity.

## 2013-01-24 NOTE — ED Provider Notes (Signed)
History     CSN: [id number]  Arrival date & time 01/24/13  4098   First MD Initiated Contact with Patient 01/24/13 1022      Chief Complaint  Patient presents with  . Toe Injury   HPI  Toe pain x 2 days.  Pt accidentally stubbed her toe against couch yesterday.  Has had L 2nd toe pain and swelling since this point.  No numbness.  Limited mobility secondary to pain.   No past medical history on file.  Past Surgical History  Procedure Laterality Date  . Cholecystectomy      No family history on file.  History  Substance Use Topics  . Smoking status: Never Smoker   . Smokeless tobacco: Not on file  . Alcohol Use: Yes    OB History   Grav Para Term Preterm Abortions TAB SAB Ect Mult Living                  Review of Systems  All other systems reviewed and are negative.    Allergies  Review of patient's allergies indicates no known allergies.  Home Medications   Current Outpatient Rx  Name  Route  Sig  Dispense  Refill  . cetirizine (ZYRTEC) 10 MG tablet   Oral   Take 10 mg by mouth daily.           BP 146/90  Temp(Src) 98.3 F (36.8 C) (Oral)  Ht 5\' 5"  (1.651 m)  Wt 182 lb (82.555 kg)  BMI 30.29 kg/m2  SpO2 95%  Physical Exam  Constitutional: She appears well-developed and well-nourished.  HENT:  Head: Normocephalic and atraumatic.  Eyes: Conjunctivae are normal. Pupils are equal, round, and reactive to light.  Neck: Normal range of motion. Neck supple.  Cardiovascular: Normal rate and regular rhythm.   Pulmonary/Chest: Effort normal.  Abdominal: Soft.  Musculoskeletal:       Feet:  Neurological: She is alert.  Skin: Skin is warm.    ED Course  Procedures (including critical care time)  Labs Reviewed - No data to display Dg Toe 2nd Left  01/24/2013  *RADIOLOGY REPORT*  Clinical Data: Toe injury.  Pain.  LEFT SECOND TOE  Comparison: None.  Findings: There is a nondisplaced fracture involving the medial base of the second middle  phalanx, with extension to the proximal interphalangeal joint.  No additional evidence of acute fracture.  IMPRESSION: Nondisplaced fracture along the medial base of the second middle phalanx.   Original Report Authenticated By: Leanna Battles, M.D.      1. Fracture, toe, left, closed, initial encounter       MDM  Buddy tape and post op shoe.  Sports medicine consult as they are available and pt will need appropriate follow up.  Treatment plan and follow up per sports medicine recs.     The patient and/or caregiver has been counseled thoroughly with regard to treatment plan and/or medications prescribed including dosage, schedule, interactions, rationale for use, and possible side effects and they verbalize understanding. Diagnoses and expected course of recovery discussed and will return if not improved as expected or if the condition worsens. Patient and/or caregiver verbalized understanding.             Doree Albee, MD 01/24/13 1137

## 2013-01-24 NOTE — Assessment & Plan Note (Addendum)
Mild and nondisplaced. Buddy tape, postop shoe, tramadol for pain. Return in 2 weeks, no x-ray needed.  I billed a fracture code for this visit, all subsequent visits for this complaint will be "post-op checks" in the global period.

## 2013-04-12 ENCOUNTER — Ambulatory Visit
Payer: PRIVATE HEALTH INSURANCE | Attending: Women's Health | Primary: Student in an Organized Health Care Education/Training Program

## 2013-07-06 ENCOUNTER — Ambulatory Visit
Payer: PRIVATE HEALTH INSURANCE | Attending: Women's Health | Primary: Student in an Organized Health Care Education/Training Program

## 2013-08-16 ENCOUNTER — Ambulatory Visit
Admit: 2013-08-16 | Discharge: 2013-08-16 | Payer: PRIVATE HEALTH INSURANCE | Primary: Student in an Organized Health Care Education/Training Program

## 2013-08-16 DIAGNOSIS — B3731 Acute candidiasis of vulva and vagina: Secondary | ICD-10-CM

## 2013-08-16 LAB — HPV HIGH RISK WITH GENOTYPING
HPV DNA High Risk Oth: POSITIVE
HPV DNA High Risk: POSITIVE
HPV Genotype 16: NEGATIVE
HPV Genotype 18: NEGATIVE

## 2013-08-16 LAB — CHLAMYDIA/GONORRHOEAE DNA THIN PREP
Chlamydia Trachomatis DNA: NEGATIVE
Neisseria Gonorrhoeae DNA: NEGATIVE

## 2013-08-16 MED ORDER — fluconazole (DIFLUCAN) 150 MG tablet
150 | ORAL_TABLET | ORAL | Status: AC
Start: 2013-08-16 — End: 2013-12-16

## 2013-08-16 NOTE — Unmapped (Signed)
[facility]   Practice Guidelines  If you do not have an established payer for your healthcare please call our financial department at [phone] or email: [email or web address].    Before coming to the [facility], bring the items listed below with you for your visit.    Insurance Card    State or Government Issued Photo ID (for example a driver???s license)  Arrival Time  We want to make the time spent in our waiting room short for our patients.    Please allow 15-20 minutes to park and/or walk to the [facility].  You will also need 5-10 minutes to check-in before your appointment.    If you get here more than 15 minutes after your appointment time, you may be asked to make another appointment.    Pregnant Patients - A nurse will talk to you about the reason why you are late.  Forms, Prescription Refills, Dr. Notes, Lab Result, etc.  Doctors and nurses have a full schedule to see patients Monday through Friday.  Since doctors and nurses are taking care of patients, requests may take longer to complete than on the same day.  You can:    Make an appointment if you need a birth control shot.    Make a doctor and/or a nurse appointment to talk about your test results.    If you need refills, call your pharmacy directly to ask for prescription refills.  Getting refills can take up to three business days to complete.    If you have forms that need to be filled out by the nurses or doctors, it will take about five business days.  You will be called when the form is ready.  Cancellation Policy  We hope that you will be seen by your doctor or nurse as soon as possible.  You can help by making every effort to attend all appointments that you make.    Call to cancel at least 24 hours before your appointment time or you will be counted as a ???no-show???.    If you do not cancel and do not show up for 3 appointments, you may be given suggestions on where to find another doctor???s office for your  care.  FMLA Forms    Please bring your FMLA forms to your appointment to discuss your request with your doctor and nurse.  Forms will be ready in 5 business days.

## 2013-08-16 NOTE — Unmapped (Signed)
I saw and examined the patient.  I discussed with the resident or fellow and agree with Alexandra Simon, MD's findings and plan as documented in the note.    HPI:  42 y.o. G3P3 for c/o vaginal d/c but also due for cervical cancer screening.  Denies Hx of abnml Paps.  Regarding vaginal d/c has remote Hx of trich, d/s if w/out odor, daily, light brown in color.  Regular monthly menses.  BTL as contraception.  Endorses SBA.    Physical Exam:  Filed Vitals:    08/16/13 1504   BP: 132/72   Pulse: 76   Temp: 97.8 ??F (36.6 ??C)     Gen: pleasant, A & Ox3, NAD  Psych: nml mood/affect  Neuro: CN 2-12 grossly intact, nml gait  Lungs: no resp distress or cough  Abd: soft/ND, NTTP, scars c/w Hx, no masses  Ext: normal gait, normal ROM x4, no LE edema or varicosities  Declined breast exam for resident  Pelvic exam per resident - I agree with description of vulvar lesions, no vaginal d/c visible to me    A&P: 42 y.o. G3P3 for annual WWE and vaginal d/c  - wet mount = hyphae  - Pap w/ cotesting  - STI screening declined  - declined screening mammogram  - RTC 2wks for I&D/bx of 2 vulvar lesions (benign appearing)    Alexandra [name] A, MD

## 2013-08-16 NOTE — Unmapped (Signed)
Subjective:       Patient ID: Alexandra [name] is a 42 y.o. female.    HPI  42 y/o G3P3   Declines mammogram  BCM BTL  No h/o abnormal pap smears, last pap smear 2010  H/o trichomonas otherwise no other STDs, 2011  Declined STD screening  C/o discharge, brown discharge no odor, like being on menses, thin, reports this has been occuring for several years, notices every day on underwear  Patient reports bumps on the inside of labia, patient reports no pruritis but states they feel they are getting bigger, no pain  Regular menses reports 9/28-10/5, normal flow, no dysmenorrhea, no intermenstrual bleeding    Past Medical History   Diagnosis Date   ??? Menarche      Age 37     Past Surgical History   Procedure Laterality Date   ??? Cesarean section  1991   ??? Tubal ligation  1996     OB History    Grav Para Term Preterm Abortions TAB SAB Ect Mult Living    3 3                History     Social History   ??? Marital Status: Single     Spouse Name: N/A     Number of Children: N/A   ??? Years of Education: N/A     Occupational History   ??? Not on file.     Social History Main Topics   ??? Smoking status: Never Smoker    ??? Smokeless tobacco: Not on file   ??? Alcohol Use: No   ??? Drug Use: No   ??? Sexually Active: Yes -- Female partner(s)     Birth Control/ Protection: None     Other Topics Concern   ??? Caffeine Use Yes     2L every other day   ??? Occupational Exposure No   ??? Exercise Yes   ??? Seat Belt Yes     Social History Narrative   ??? No narrative on file     Family History   Problem Relation Age of Onset   ??? Diabetes Father      NIDDM   ??? Diabetes Paternal Grandmother      IDDM   ??? Breast cancer Neg Hx    ??? Colon cancer Neg Hx    ??? Ovarian cancer Neg Hx            Review of Systems   All other systems reviewed and are negative.        Objective:    Physical Exam   Constitutional: She is oriented to person, place, and time. She appears well-developed and well-nourished. No distress.   HENT:   Head: Normocephalic and atraumatic.   Eyes: EOM are  normal.   Neck: Normal range of motion. No thyromegaly present.   Cardiovascular: Normal rate and regular rhythm.    Pulmonary/Chest: Effort normal and breath sounds normal. No respiratory distress.   Abdominal: Soft. Bowel sounds are normal. She exhibits no distension. There is no tenderness.   obese   Genitourinary:         External genitalia with L periclitoral cyst ~1cmx1cm, L inferior labial cyst c/w inclusion cyst ~0.5x0.5cm.  Otherwise normal appearing external genitalia.  Normal appearing urethral meatus.  Vagina with thick white discharge.  Cervix mildly friable.  Retroverted uterus.  No adnexal tenderness, masses or fullness.   Musculoskeletal: Normal range of motion. She exhibits no edema.  Neurological: She is alert and oriented to person, place, and time.   Skin: Skin is warm and dry.   Psychiatric: She has a normal mood and affect.     Filed Vitals:    08/16/13 1504   BP: 132/72   Pulse: 76   Temp: 97.8 ??F (36.6 ??C)   TempSrc: Oral   Height: 5' 2 (1.575 m)   Weight: 219 lb 9.6 oz (99.61 kg)     Wet prep: +hyphae, negative for clue cells, negative for trichomonads      Assessment:     42 y/o G3P3 here for c/o visit of discharge, due for pap smear    Plan:   1.  Well woman  -Declines mammogram at this time  -Pap smear performed  -BTL BCM  -Declines STD screening    2. Vulvovaginal candidiasis  -Diflucan prescribed    3. L vulvar cysts  -Patient requests removal  -Instructed to take Motrin/Tylenol prior to visit    4. Dispo  -RTC 2 weeks for excision of vulvar cysts    Edison Simon, MD  PGY-3

## 2013-08-16 NOTE — Unmapped (Signed)
No RN exit today -  Please see MD note for plan.  T. Beecher Furio, RN

## 2013-08-23 NOTE — Unmapped (Signed)
Please call patient regarding abnormal pap smear and assist in coordinating dysplasia clinic appointment.  Order for referral placed with this phone note.  Thanks!

## 2013-08-25 NOTE — Unmapped (Signed)
Thank you Marybeth.

## 2013-08-30 ENCOUNTER — Ambulatory Visit: Payer: PRIVATE HEALTH INSURANCE | Primary: Student in an Organized Health Care Education/Training Program

## 2013-08-30 NOTE — Unmapped (Signed)
Attempted call to pt, but phone number is out of service. Planned to review abnormal pap result and recommendation for colposcopy. No response to letter mailed 08/25/13.

## 2013-09-13 ENCOUNTER — Ambulatory Visit
Admit: 2013-09-13 | Payer: PRIVATE HEALTH INSURANCE | Primary: Student in an Organized Health Care Education/Training Program

## 2013-09-13 DIAGNOSIS — N9089 Other specified noninflammatory disorders of vulva and perineum: Secondary | ICD-10-CM

## 2013-09-13 MED ORDER — lidocaine 10 mg/mL (1 %) injection 10 mL
10 | Freq: Once | INTRAMUSCULAR | Status: AC
Start: 2013-09-13 — End: 2017-05-22

## 2013-09-13 NOTE — Unmapped (Signed)
[facility]   Practice Guidelines  If you do not have an established payer for your healthcare please call our financial department at [phone] or email: [email or web address].    Before coming to the [facility], bring the items listed below with you for your visit.    Insurance Card    State or Government Issued Photo ID (for example a driver???s license)  Arrival Time  We want to make the time spent in our waiting room short for our patients.    Please allow 15-20 minutes to park and/or walk to the [facility].  You will also need 5-10 minutes to check-in before your appointment.    If you get here more than 15 minutes after your appointment time, you may be asked to make another appointment.    Pregnant Patients - A nurse will talk to you about the reason why you are late.  Forms, Prescription Refills, Dr. Notes, Lab Result, etc.  Doctors and nurses have a full schedule to see patients Monday through Friday.  Since doctors and nurses are taking care of patients, requests may take longer to complete than on the same day.  You can:    Make an appointment if you need a birth control shot.    Make a doctor and/or a nurse appointment to talk about your test results.    If you need refills, call your pharmacy directly to ask for prescription refills.  Getting refills can take up to three business days to complete.    If you have forms that need to be filled out by the nurses or doctors, it will take about five business days.  You will be called when the form is ready.  Cancellation Policy  We hope that you will be seen by your doctor or nurse as soon as possible.  You can help by making every effort to attend all appointments that you make.    Call to cancel at least 24 hours before your appointment time or you will be counted as a ???no-show???.    If you do not cancel and do not show up for 3 appointments, you may be given suggestions on where to find another doctor???s office for your  care.  FMLA Forms    Please bring your FMLA forms to your appointment to discuss your request with your doctor and nurse.  Forms will be ready in 5 business days.

## 2013-09-13 NOTE — Unmapped (Signed)
I saw and examined the patient.  I discussed with the resident or fellow and agree with Edison Simon, MD's findings and plan as documented in the note.    HPI:  42 y.o. G3P3 for vulvar cyst excision - suspected to be sebaceous cysts.      Physical Exam:  Filed Vitals:    09/13/13 1450   BP: 128/84   Pulse: 76   Temp: 97.8 ??F (36.6 ??C)     Gen: pleasant, A & Ox3, NAD  Psych: nml mood/affect  Neuro: CN 2-12 grossly intact, nml gait  Lungs: no resp distress or cough  Abd: soft/ND, NTTP, scars c/w Hx, no masses  Ext: normal gait, normal ROM x4, no LE edema or varicosities    Pelvic per resident note - I was present for this uncomplicated vulvar sebaceous cystectomy and I&D of separate small labial abscess, silver nitrate for hemostasis at both sites    A&P: 42 y.o. for vulvar cystectomy and I&D  - uncomplicated, benign appearing  - f/u healing at scheduled colpo appt    Jonel Sick A, MD

## 2013-09-13 NOTE — Unmapped (Signed)
S: Patient here for follow-up, desires removal of 2 vulvar cysts.  Patient also with ASCUS HR HPV+, patient is scheduled for colposcopy on 09/19/13.    Discussed R/B/A of procedure, patient signed consent form.  O:  Filed Vitals:    09/13/13 1450   BP: 128/84   Pulse: 76   Temp: 97.8 ??F (36.6 ??C)   TempSrc: Oral   Height: 5' 2 (1.575 m)   Weight: 223 lb 14.4 oz (101.56 kg)     Gen: NAD  HEENT: Normocpehalic, atraumatic  Resp: No increased work of breathing  Abd; obese  Ext: warm well-perfused    Procedure Note:  Consent obtained.  Time out performed.  Patient placed in dorsal lithotomy position in stirrups.  2 vulvar cystic lesions identified areas swabbed with alcohol and injected with 10 cc lidocaine with epi total.  The vulva was prepped with betadine and draped.  An 11 blade scalpel was used to incise the inferior cyst, thick sebaceous material was expressed.  Hemostasis obtained with silver nitrate stick.  Attention then turned to the superior incision.  Cyst incised and pus drained.  Attempted hemostasis with silver nitrate stick however 1 interrupted stitch with 3-O vicryl was required.  Hemostasis obtained.  Patient tolerated the procedure well.    A/P: 42 y/o G3P3 s/p I&D of vulvar cysts, appeared sebaceous in nature  -Follow-up on 11/17 for colposcopy, vulva may be examined at that time if there are any concerns    Edison Simon, MD  PGY-3

## 2013-09-13 NOTE — Progress Notes (Signed)
 Patient seen/discharged by MD see note.

## 2013-09-19 ENCOUNTER — Ambulatory Visit: Payer: PRIVATE HEALTH INSURANCE | Primary: Student in an Organized Health Care Education/Training Program

## 2013-10-17 ENCOUNTER — Encounter: Payer: PRIVATE HEALTH INSURANCE | Primary: Student in an Organized Health Care Education/Training Program

## 2013-11-07 ENCOUNTER — Ambulatory Visit: Payer: PRIVATE HEALTH INSURANCE | Primary: Student in an Organized Health Care Education/Training Program

## 2013-12-05 ENCOUNTER — Encounter: Payer: PRIVATE HEALTH INSURANCE | Primary: Student in an Organized Health Care Education/Training Program

## 2013-12-16 ENCOUNTER — Ambulatory Visit
Admit: 2013-12-16 | Payer: PRIVATE HEALTH INSURANCE | Primary: Student in an Organized Health Care Education/Training Program

## 2013-12-16 ENCOUNTER — Inpatient Hospital Stay
Admit: 2013-12-16 | Payer: PRIVATE HEALTH INSURANCE | Primary: Student in an Organized Health Care Education/Training Program

## 2013-12-16 DIAGNOSIS — N879 Dysplasia of cervix uteri, unspecified: Secondary | ICD-10-CM

## 2013-12-16 DIAGNOSIS — N870 Mild cervical dysplasia: Secondary | ICD-10-CM

## 2013-12-16 LAB — HCG URINE, QUALITATIVE: Preg Test, Ur: NEGATIVE

## 2013-12-16 MED ORDER — ibuprofen (ADVIL,MOTRIN) tablet 800 mg
800 | Freq: Once | ORAL | Status: AC
Start: 2013-12-16 — End: 2013-12-16
  Administered 2013-12-16: 20:00:00 800 mg via ORAL

## 2013-12-16 NOTE — Unmapped (Signed)
Rn exit  Post procedure instructions reviewed

## 2013-12-16 NOTE — Unmapped (Signed)
Subjective:       Patient ID: Alexandra [name] is a 43 y.o. female.    HPI Pateint is a 43 yo G43P3003 female who presents for colposcopy for ASCUS HR HPV positive pap smear 08/2013.  She reports a remote history of an abnormal pap smear but had a normal colposcopy.  She has a history of gonorrhea/trichomonas as a teenager.  She is currently sexually active. She is s/p tubal ligation for birth control. She is not a smoker.    Review of Systems--->Non contributory    Objective:     Filed Vitals:    12/16/13 1428   BP: 152/106   Pulse: 71   Temp: 96.8 ??F (36 ??C)   TempSrc: Oral   Height: 5' 2 (1.575 m)   Weight: 221 lb 11.2 oz (100.562 kg)        Physical Exam   Constitutional: She is oriented to person, place, and time. She appears well-developed and well-nourished.   HENT:   Head: Normocephalic.   Eyes: Conjunctivae are normal.   Neck: Normal range of motion. Neck supple.   Pulmonary/Chest: Effort normal.   Genitourinary:       Musculoskeletal: Normal range of motion.   Neurological: She is alert and oriented to person, place, and time.   Psychiatric: She has a normal mood and affect. Her behavior is normal. Judgment and thought content normal.     Colposcopy Procedure Note    Indications: Pap smear 4 months ago showed: ASCUS with POSITIVE high risk HPV. The prior pap showed no abnormalities.  Prior cervical/vaginal disease: none.     Procedure Details   The risks and benefits of the procedure were discussed.  Questions were addressed.  Informed consent obtained.    Speculum placed in vagina and visualization of cervix achieved, cervix swabbed x 3 with acetic acid solution.    Findings:  SCJ Visualization: adequate    Procedural Details:  Cervix: Lesion 1: 7:00 at 8:00 o'clock with AW changes..  Limit seen.     Specimens: See above for location and type of specimen.      Complications: none      Assessment:   43 yo G40P3003 female with ASCUS HR HPV pap smear  Plan:   1. 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 BCM:   -S/P BTL  3. RTC:   -Will contact with follow up plan    Evern Bio, MD  PGY4

## 2013-12-16 NOTE — Unmapped (Signed)
Colposcopy  Care After  Colposcopy is a procedure in which a special tool is used to magnify the surface of the cervix. A tissue sample (biopsy) may also be taken. This sample will be looked at for cervical cancer or other problems. After the test:  ?? You may have some cramping.  ?? Lie down for a few minutes if you feel lightheaded.  ??  You may have some bleeding which should stop in a few days.  HOME CARE  ?? Do not have sex or use tampons for 2 to 3 days or as told.  ?? Only take medicine as told by your doctor.  ?? Continue to take your birth control pills as usual.  Finding out the results of your test  Ask when your test results will be ready. Make sure you get your test results.  GET HELP RIGHT AWAY IF:  ?? You are bleeding a lot or are passing blood clots.  ?? You develop a fever of 102?? F (38.9?? C) or higher.  ?? You have abnormal vaginal discharge.  ?? You have cramps that do not go away with medicine.  ?? You feel lightheaded, dizzy, or pass out (faint).  MAKE SURE YOU:   ?? Understand these instructions.  ?? Will watch your condition.  ?? Will get help right away if you are not doing well or get worse.  Document Released: 04/07/2008 Document Revised: 01/12/2012 Document Reviewed: 04/07/2008  ExitCare?? Patient Information ??2014 [employer].

## 2013-12-16 NOTE — Unmapped (Signed)
Procedure:  I was present for the entire procedure.    Procedure Statement: colposcopy completed with biopsy and ECC

## 2014-01-04 NOTE — Unmapped (Signed)
Need plan of care for colposcopy done 12/16/13.

## 2014-01-04 NOTE — Unmapped (Signed)
Repeat PAP in 12 months

## 2014-01-05 NOTE — Unmapped (Signed)
Call placed to pt. Reviewed colposcopy results and recommendation for follow-up pap and HPV testing in 12 months (12/2014)

## 2014-01-05 NOTE — Unmapped (Addendum)
Pt complains of continued vaginal discharge post colposcopy. Offered appt for tomorrow, but pt requested appt for next week. Appt scheduled for 01/09/14. Pt will call to cancel if discharge is no longer a problem.

## 2014-01-09 ENCOUNTER — Inpatient Hospital Stay
Admit: 2014-01-09 | Payer: PRIVATE HEALTH INSURANCE | Primary: Student in an Organized Health Care Education/Training Program

## 2014-01-09 ENCOUNTER — Ambulatory Visit
Admit: 2014-01-09 | Payer: PRIVATE HEALTH INSURANCE | Primary: Student in an Organized Health Care Education/Training Program

## 2014-01-09 DIAGNOSIS — N898 Other specified noninflammatory disorders of vagina: Secondary | ICD-10-CM

## 2014-01-09 DIAGNOSIS — N760 Acute vaginitis: Secondary | ICD-10-CM

## 2014-01-09 LAB — URINALYSIS, MICROSCOPIC
RBC, UA: 4 /HPF (ref 0–3)
Squam Epithel, UA: 7 /HPF (ref 0–5)
WBC, UA: 1 /HPF (ref 0–5)

## 2014-01-09 LAB — CHLAMYDIA / GONORRHOEAE DNA SWAB
Chlamydia Trachomatis DNA Swab: NEGATIVE
Neisseria gonorrhoeae DNA Swab: NEGATIVE

## 2014-01-09 MED ORDER — metroNIDAZOLE (METROGEL VAGINAL) 0.75 % vaginal gel
0.75 | Freq: Every day | VAGINAL | 0.00 refills | 30.00000 days | Status: AC
Start: 2014-01-09 — End: 2014-01-14

## 2014-01-09 NOTE — Unmapped (Signed)
No RN exit  See note from Dr. Nickolas Madrid

## 2014-01-09 NOTE — Unmapped (Signed)
I did not see or examine the patient. I discussed with the resident and agree with ERIN E DOUGHER, DO's findings and plan as documented in the resident's note.    Archie Atilano Flood Shaffer, MD

## 2014-01-09 NOTE — Unmapped (Signed)
GYN Visit     HPI: patient is s/p colpo 12/16/13, with results of CIN 1. She presents due to persistent vaginal discharge, dark brown in color, no odor, thin. She does not have any vaginal irritation or dysuria. Does report intercourse x1 since colpo.     GYN: remote history of gonorrhea at age 43.     Filed Vitals:    01/09/14 1500   BP: 118/97   Pulse: 76   Temp: 97.8 ??F (36.6 ??C)     GEN NAD   Abd soft NT ND   GU: NEFG, thin white grey discharge, no odor. No CMT, cervix normal appearing well healed without lesion. No adnexal mass or tenderness, uterus midline mobile, non tender   Ext WWP x4     Wet mount: + clue cells, absent trich and yeast     A/P 43 y.o. G3P3 here for persistent vaginal discharge.     1. Vaginal discharge  - + clue cells with discharge consistent for bacterial vaginosis   - patient desires to use gel vs pills   - Rx given for metrogel, to use daily for 5 days.   - RTC if symptoms do not improve.     Alexandra [name]   [facility] PGY 4

## 2014-01-10 ENCOUNTER — Inpatient Hospital Stay
Admit: 2014-01-10 | Payer: PRIVATE HEALTH INSURANCE | Primary: Student in an Organized Health Care Education/Training Program

## 2016-03-18 ENCOUNTER — Ambulatory Visit: Payer: PRIVATE HEALTH INSURANCE | Primary: Student in an Organized Health Care Education/Training Program

## 2017-05-22 ENCOUNTER — Ambulatory Visit
Admit: 2017-05-22 | Payer: PRIVATE HEALTH INSURANCE | Primary: Student in an Organized Health Care Education/Training Program

## 2017-05-22 ENCOUNTER — Other Ambulatory Visit
Admit: 2017-05-22 | Payer: PRIVATE HEALTH INSURANCE | Primary: Student in an Organized Health Care Education/Training Program

## 2017-05-22 DIAGNOSIS — Z0000 Encounter for general adult medical examination without abnormal findings: Secondary | ICD-10-CM

## 2017-05-22 LAB — RENAL FUNCTION PANEL W/EGFR
Albumin: 4.3 g/dL (ref 3.5–5.7)
Anion Gap: 9 mmol/L (ref 3–16)
BUN: 11 mg/dL (ref 7–25)
CO2: 26 mmol/L (ref 21–33)
Calcium: 9.7 mg/dL (ref 8.6–10.3)
Chloride: 102 mmol/L (ref 98–110)
Creatinine: 0.85 mg/dL (ref 0.60–1.30)
Glucose: 84 mg/dL (ref 70–100)
Osmolality, Calculated: 283 mOsm/kg (ref 278–305)
Phosphorus: 3.5 mg/dL (ref 2.1–4.7)
Potassium: 4.6 mmol/L (ref 3.5–5.3)
Sodium: 137 mmol/L (ref 133–146)
eGFR AA CKD-EPI: >90 See note.
eGFR NONAA CKD-EPI: 82 See note.

## 2017-05-22 LAB — CBC
Hematocrit: 38.2 % (ref 35.0–45.0)
Hemoglobin: 12.6 g/dL (ref 11.7–15.5)
MCH: 29.9 pg (ref 27.0–33.0)
MCHC: 33 g/dL (ref 32.0–36.0)
MCV: 90.7 fL (ref 80.0–100.0)
MPV: 8.9 fL (ref 7.5–11.5)
Platelets: 293 10*3/uL (ref 140–400)
RBC: 4.22 10*6/uL (ref 3.80–5.10)
RDW: 14.1 % (ref 11.0–15.0)
WBC: 4.9 10*3/uL (ref 3.8–10.8)

## 2017-05-22 LAB — LIPID PANEL
Cholesterol, Total: 192 mg/dL (ref 0–200)
HDL: 68 mg/dL (ref 60–92)
LDL Cholesterol: 106 mg/dL
Triglycerides: 91 mg/dL (ref 10–149)

## 2017-05-22 LAB — HIV 1+2 ANTIBODY/ANTIGEN WITH REFLEX: HIV 1+2 AB/AGN: NONREACTIVE

## 2017-05-22 MED ORDER — amLODIPine (NORVASC) 10 MG tablet
10 | ORAL_TABLET | Freq: Every day | ORAL | 3 refills | Status: AC
Start: 2017-05-22 — End: 2017-08-04

## 2017-05-22 MED ORDER — ibuprofen (ADVIL,MOTRIN) 600 MG tablet
600 | ORAL_TABLET | Freq: Four times a day (QID) | ORAL | 1 refills | Status: AC | PRN
Start: 2017-05-22 — End: 2017-06-26

## 2017-05-22 NOTE — Unmapped (Signed)
Likely muscular pain, however given obesity could have some OA from strain?  Gave handout for shoulder exercise, if that does not work will try PT and possibly imaging.  Ibuprofen as needed for pain.

## 2017-05-22 NOTE — Unmapped (Signed)
Restart amlodipine 10mg

## 2017-05-22 NOTE — Unmapped (Signed)
45 yo woman here to establish care.   complains of bilat shoulder pain for over a year. Denies known trauma. Reporting '10/10' pain.  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     General: Well, no distress. Alert and oriented x 3  Heart: RRR, no m/g/r. No carotid bruits.   Lungs: Clear to ascultation bilaterally, normal respiratory effort  Ext: No LE edema     Left shoulder - minimal tenderness anywhere.  R shoulder: superior GH joint tenderness, some trapezius tenderness. No AC tenderness   Nl ROM both shoulders but some pain with raising arms.    1) Bilat shoulder pain - poss rotator cuff dz. Trial NSAID. Already tried masssage - may do massage and heat on trapezius. Encouraged patient to keep working ROM. May consider PT if no improvement, and if still not better then could consider MRI.  2) HTN - been out of amlodipine 10mg  - restart this.  3) Morbid obesity - addressing pop habit ( > 2 L day)     I saw and examined the patient.  I discussed with the resident or fellow and agree with Mindi Junker, MD's findings and plan as documented in the note.    Melody Haver, MD

## 2017-05-22 NOTE — Unmapped (Signed)
HM labs

## 2017-05-22 NOTE — Unmapped (Addendum)
We will try some ibuprofen for the shoulder pain and refill your blood pressure medication.

## 2017-05-22 NOTE — Unmapped (Signed)
Due for repeat pap, to call gyn and set up appt

## 2017-05-22 NOTE — Unmapped (Signed)
[facility]     Subjective   Alexandra [name] is a 46 y.o. female w/ a hx of HTN who presents for NPV.    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  Worsening.  Pain is sharp 10/10, worsened with movement and at night with sleeping.  Stiff in the morning.  No other joints that hurt.  No back pain, no radiation.      grandamother - DMII, HTN  Father - DMII, HTN    Tubal ligation   Quit smoking 2005 - pot    Health Maintenance:  TDAP today    PAP 2015 - needs repeat  ???? ?? 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  ???? ?? - Negative for high-grade dysplasia or malignancy.    Pertinent History   As above     Medications     Current Outpatient Prescriptions   Medication Sig   ??? acetaminophen Take by mouth.   ??? amLODIPine Take 1 tablet (10 mg total) by mouth daily.   ??? ibuprofen Take 1 tablet (600 mg total) by mouth every 6 hours as needed for Pain.     No current facility-administered medications for this visit.        Objective     Wt Readings from Last 3 Encounters:   05/22/17 (!) 235 lb 9.6 oz (106.9 kg)   01/09/14 218 lb 11.2 oz (99.2 kg)   12/16/13 221 lb 11.2 oz (100.6 kg)     BP Readings from Last 5 Encounters:   05/22/17 (!) 156/92   01/09/14 (!) 118/97   12/16/13 (!) 152/106   09/13/13 128/84   08/16/13 132/72     BP (!) 156/92    Pulse 66    Wt (!) 235 lb 9.6 oz (106.9 kg)    LMP 05/05/2017 (Exact Date)    BMI 43.09 kg/m??     Physical Exam   General: Obese, NAD  HEENT: multiple facial piercing's   Neck: Neck is supple.  No thyromegaly or cervical lymphadenopathy.  CV: Regular rate and rhythm. Normal S1 and S2. No murmurs or extracardiac sounds.   Lungs: Clear to auscultation bilaterally. No wheezes or crackles. No respiratory distress.   Abdomen: Bowel sounds present. Soft, nondistended, nontender without associated rebound or  guarding, no palpable masses.   Back/Flank: No TTP, No costovertebral angle tenderness to palpation  Extremities: mild tenderness lateral deltoid, trap.  Full ROM, some pain with ROM.  5/5 strength   Skin: No bruising or rash noted. No spider angiomata or palmar erythema.   Neuro: AAOx person, place, and time. CN 3-12 grossly intact. No gross motor defecits.  Psych: Normal mood and affect. Normal speech and behavior.     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  Gave handout for shoulder exercise, if that does not work will try PT and possibly imaging.  Ibuprofen as needed for pain.    Healthcare maintenance  HM labs  RTC 62mo    Mindi Junker, MD   Internal Medicine PGY-2  05/22/2017  1:59 PM    Visit Orders Placed  Orders Placed This Encounter   Procedures   ??? Tdap Vaccine (BOOSTRIX)   ??? Renal Function Panel w/EGFR   ??? CBC   ??? Lipid Profile   ??? HIV 1+2 Antibody/Antigen with Reflex   ??? Physical Therapy     Modified Medications    Modified Medication Previous Medication    AMLODIPINE (NORVASC) 10 MG TABLET amLODIPine (NORVASC) 10 MG tablet       Take 1 tablet (10 mg total) by mouth daily.    Take by mouth.     New Prescriptions    IBUPROFEN (ADVIL,MOTRIN) 600 MG TABLET    Take 1 tablet (600 mg total) by mouth every 6 hours as needed for Pain.

## 2017-06-19 ENCOUNTER — Ambulatory Visit: Payer: PRIVATE HEALTH INSURANCE | Primary: Student in an Organized Health Care Education/Training Program

## 2017-06-26 MED ORDER — ibuprofen (ADVIL,MOTRIN) 600 MG tablet
600 | ORAL_TABLET | Freq: Four times a day (QID) | ORAL | 1 refills | Status: AC | PRN
Start: 2017-06-26 — End: 2017-08-04

## 2017-07-15 ENCOUNTER — Ambulatory Visit
Payer: PRIVATE HEALTH INSURANCE | Attending: "Women's Health Care | Primary: Student in an Organized Health Care Education/Training Program

## 2017-08-04 ENCOUNTER — Ambulatory Visit
Admit: 2017-08-04 | Payer: PRIVATE HEALTH INSURANCE | Primary: Student in an Organized Health Care Education/Training Program

## 2017-08-04 DIAGNOSIS — M25511 Pain in right shoulder: Secondary | ICD-10-CM

## 2017-08-04 MED ORDER — amLODIPine (NORVASC) 10 MG tablet
10 | ORAL_TABLET | Freq: Every day | ORAL | 3 refills | Status: AC
Start: 2017-08-04 — End: 2018-08-23

## 2017-08-04 MED ORDER — naproxen (NAPROSYN) 500 MG tablet
500 | ORAL_TABLET | Freq: Two times a day (BID) | ORAL | 0 refills | Status: AC
Start: 2017-08-04 — End: 2017-09-07

## 2017-08-04 NOTE — Assessment & Plan Note (Signed)
PT, likely rotator cuff pathology

## 2017-08-04 NOTE — Unmapped (Signed)
We will refer you over to orthopedics to see if they can help with your shoulder pain.    Additionally, we will send you to the weight loss specialists.

## 2017-08-04 NOTE — Unmapped (Signed)
I did not see or examine the patient. I discussed with the resident and agree with Ashok Norris, MD's findings and plan as documented in the resident's note.    46 yo F here for follow up.    1. HTN - Better. On Amlodipine 10 mg daily. Ideally goal <130/80 mm Hg. Discussed salt reduction and weight loss.    2. Bilateral chronic shoulder pain - Suspect rotator cuff tendinopathy. Declined PT. Refer to orthopaedics.      Pixie Casino, DO

## 2017-08-04 NOTE — Unmapped (Signed)
[facility]     Subjective   Alexandra [name] is a 46 y.o. female w/ a hx of HTN, shoulder pain who presents for follow up.    Last Visit: 05/2017 - NPV, establish care, restarted BP meds    Today: Still with shoulder pain, but otherwise doing well.  ROS negative      Health Maintenance:  PAP 2015 - needs repeat  ???? ?? 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  ???? ?? - Negative for high-grade dysplasia or malignancy.    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  Worsening.  Pain is sharp 10/10, worsened with movement and at night with sleeping.  Stiff in the morning.  No other joints that hurt.  No back pain, no radiation.    HTN  BP Readings from Last 3 Encounters:   08/04/17 136/83   05/22/17 (!) 156/92   01/09/14 (!) 118/97    - Meds: Amlodipine 10    Medications     Current Outpatient Prescriptions   Medication Sig   ??? amLODIPine Take 1 tablet (10 mg total) by mouth daily.   ??? acetaminophen Take by mouth.   ??? naproxen Take 1 tablet (500 mg total) by mouth 2 times a day with meals.     No current facility-administered medications for this visit.        Objective     Wt Readings from Last 3 Encounters:   08/04/17 (!) 240 lb (108.9 kg)   05/22/17 (!) 235 lb 9.6 oz (106.9 kg)   01/09/14 218 lb 11.2 oz (99.2 kg)     BP Readings from Last 5 Encounters:   08/04/17 136/83   05/22/17 (!) 156/92   01/09/14 (!) 118/97   12/16/13 (!) 152/106   09/13/13 128/84     BP 136/83    Pulse 80    Ht 5' 2 (1.575 m)    Wt (!) 240 lb (108.9 kg)    LMP 06/28/2017    SpO2 100%    BMI 43.90 kg/m??     Physical Exam   General: Obese, NAD  HEENT: multiple facial piercing's   Neck: Neck is supple.  No thyromegaly or cervical lymphadenopathy.  CV: Regular rate and rhythm. Normal S1 and S2. No murmurs or extracardiac sounds.   Lungs: Clear to  auscultation bilaterally. No wheezes or crackles. No respiratory distress.   Abdomen: Bowel sounds present. Soft, nondistended, nontender without associated rebound or guarding, no palpable masses.   Back/Flank: No TTP, No costovertebral angle tenderness to palpation  Extremities: mild tenderness lateral deltoid, trap, +empty can test  Full passive ROM, pain with active ROM. Unable to raise arm above 90* 5/5 strength   Skin: No bruising or rash noted. No spider angiomata or palmar erythema.   Neuro: AAOx person, place, and time. CN 3-12 grossly intact. No gross motor defecits.  Psych: Normal mood and affect. Normal speech and behavior.     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 Orthopedics   ??? Bariatric Surgery   ??? Physical Therapy     Modified Medications    Modified Medication Previous Medication    AMLODIPINE (NORVASC) 10 MG TABLET amLODIPine (NORVASC) 10 MG tablet       Take 1 tablet (10 mg total) by mouth daily.    Take 1 tablet (10 mg total) by mouth daily.     New Prescriptions    NAPROXEN (NAPROSYN) 500 MG TABLET    Take 1 tablet (500 mg total) by mouth 2 times a day with meals.

## 2017-08-04 NOTE — Unmapped (Signed)
Due to pap, states she will schedule

## 2017-08-04 NOTE — Unmapped (Signed)
Discussed decreasing salt intake and weight loss  Continue amlodipine 10  May need to add a 2nd agent next time

## 2017-08-05 NOTE — Telephone Encounter (Signed)
Left message with my contact information to call to discuss weight loss options.  Added patients information to contact tracker and will continue to reach out to patient.

## 2017-08-11 NOTE — Telephone Encounter (Signed)
Left message with my contact information to call to discuss weight loss options.  Added patients information to contact tracker and will continue to reach out to patient.

## 2017-08-18 ENCOUNTER — Other Ambulatory Visit: Payer: Self-pay | Admitting: Family Medicine

## 2017-08-18 DIAGNOSIS — Z1231 Encounter for screening mammogram for malignant neoplasm of breast: Secondary | ICD-10-CM

## 2017-08-18 NOTE — Telephone Encounter (Signed)
Left message with my contact information to call to discuss weight loss options.  Added patients information to contact tracker and will continue to reach out to patient.

## 2017-08-24 NOTE — Unmapped (Signed)
We have attempted to reach out to this patient three times through either voice mail and/or email without any response.  We just sent a letter as our final attempt to contact.

## 2017-08-31 NOTE — Unmapped (Signed)
This is a notification of a Discharge Alert generated by [facility].     This patient visited the following location: [facility])  Admit Date: 08/31/2017 16:00  Discharge Date: 08/31/2017 19:46  Visit Type: Emergency  Chief complaint:   Diagnosis:  ()  Alert Category:   Attending Physician:   Referring Physician:   Consulting Physician:   Copied Physician(s):     [facility] is a not-for-profit corporation that was founded in 1997 as a   community effort to enhance the ability to share health information   electronically in the [place] area. Today, [facility]   is one of the nation????????s largest and most financially sustainable regional   health information exchange (HIE) organizations.

## 2017-09-02 ENCOUNTER — Ambulatory Visit (INDEPENDENT_AMBULATORY_CARE_PROVIDER_SITE_OTHER): Payer: BLUE CROSS/BLUE SHIELD

## 2017-09-02 DIAGNOSIS — Z1231 Encounter for screening mammogram for malignant neoplasm of breast: Secondary | ICD-10-CM

## 2017-09-07 MED ORDER — naproxen (NAPROSYN) 500 MG tablet
500 | ORAL_TABLET | Freq: Two times a day (BID) | ORAL | 0 refills | Status: AC
Start: 2017-09-07 — End: 2017-12-27

## 2017-09-29 ENCOUNTER — Encounter: Payer: PRIVATE HEALTH INSURANCE | Primary: Student in an Organized Health Care Education/Training Program

## 2017-10-06 ENCOUNTER — Inpatient Hospital Stay
Admit: 2017-10-06 | Payer: PRIVATE HEALTH INSURANCE | Primary: Student in an Organized Health Care Education/Training Program

## 2017-10-06 ENCOUNTER — Ambulatory Visit
Admit: 2017-10-06 | Discharge: 2017-10-06 | Payer: PRIVATE HEALTH INSURANCE | Primary: Student in an Organized Health Care Education/Training Program

## 2017-10-06 DIAGNOSIS — M25511 Pain in right shoulder: Secondary | ICD-10-CM

## 2017-10-06 DIAGNOSIS — M19012 Primary osteoarthritis, left shoulder: Secondary | ICD-10-CM

## 2017-10-06 NOTE — Unmapped (Addendum)
CC: bilateral shoulder pain    HPI: Alexandra [name] is a 46 y.o. female who presents to clinic today with complaints of bilateral shoulder pain. States her shoulders have been hurting for years with no history of any injury. She was referred by her PCP. She does not take any NSAIDs for her pain. She was recommended to start PT, but she declined and when I asked her she stated she doesn't have time to go because of work. She states the pain is so bad she can't even get her shirt on by herself and needs help to get her arms overhead. 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 I asked how she was able to do that work if shes not able to dress herself and she said she gets help from a coworker and has been for the last few years. States the right is worse than the left. Worse with activity, unable to get overhead. Nothing makes it better. States pain is on top of both shoulders and radiates up into base of her neck and at times down to the sides of her upper arms. Denies numbness, tingling.   She is RHD.     ROS: All others negative except for as stated in HPI    Past Medical History:   Diagnosis Date   ??? Menarche     Age 12   ??? STD (sexually transmitted disease)     history of GC/trichomonas as teen       Past Surgical History:   Procedure Laterality Date   ??? CESAREAN SECTION  1991   ??? TUBAL LIGATION  1996       Social History     Social History   ??? Marital status: Single     Spouse name: N/A   ??? Number of children: N/A   ??? Years of education: N/A     Occupational History   ??? Not on file.     Social History Main Topics   ??? Smoking status: Never Smoker   ??? Smokeless tobacco: Never Used   ??? Alcohol use No   ??? Drug use: No   ??? Sexual activity: Yes     Partners: Male     Birth control/ protection: None     Other Topics Concern   ??? Caffeine Use Yes     2L every other day   ??? Occupational Exposure No   ??? Exercise Yes   ??? Seat Belt Yes     Social History Narrative   ??? No narrative on file       No Known  Allergies    Current Outpatient Prescriptions on File Prior to Visit   Medication Sig Dispense Refill   ??? acetaminophen (TYLENOL) 500 MG tablet Take by mouth.     ??? amLODIPine (NORVASC) 10 MG tablet Take 1 tablet (10 mg total) by mouth daily. 90 tablet 3   ??? naproxen (NAPROSYN) 500 MG tablet Take 1 tablet (500 mg total) by mouth 2 times a day with meals. 60 tablet 0     No current facility-administered medications on file prior to visit.          Physical Exam:     Vitals:    10/06/17 1433   Height: 5' 2 (1.575 m)       General: No distress, Alert & Oriented X 4 and Well-nourished  CV: RRR  Resp: Breathing unlabored   No obvious deformity upon inspection. No atrophy or  swelling noted. She actively flexes shoulders up to 90 degrees and states she can't go any further. When I try to passively flex her up more she actually seems like she is resisting me and states it hurts.   R arm shows full IR/ER without any pain. nontender to palpation AC joint, subacromial space. Some tenderness along trapezius. Able to fully passively range her shoulder when I do just the right by itself and she can actively hold it there.   While distracting her with the exam on the right, she actually lifts up the left arm over her head to grab her right arm at one point.   ER/IR 5/5 strength. Biceps/triceps 5/5 strength  L arm shows full IR/ER without any pain and 5/5 strength. nontender AC and subacromial space. Some tenderness along trapezius just as it was on the other side.   Full passive ROM  NV intact throughout  Full cervical spine ROM without pain or grimmace     Imaging reviewed:   BL shoulder XR show no acute osseous abnormalities     Assessment  46 y.o. female with chronic bilateral shoulder pain, some tightness in bilateral trapezius muscles     Plan  1. Discussed with her the xrays and her exam show she doesn't have any sort of shoulder pathology. Only thing I'd recommend is to get into PT to work on some modalities and maybe try  some dry needling in her traps  2. Follow up PRN    Answered all of the patient's questions to her satisfaction and understanding and she is in agreement with the plan.     Nasser Ku L Vilma Will

## 2017-12-28 MED ORDER — naproxen (NAPROSYN) 500 MG tablet
500 | ORAL_TABLET | ORAL | 0 refills | Status: AC
Start: 2017-12-28 — End: 2018-02-18

## 2017-12-28 NOTE — Unmapped (Signed)
Last seen 10/2    No Known Allergies

## 2018-02-18 MED ORDER — naproxen (NAPROSYN) 500 MG tablet
500 | ORAL_TABLET | ORAL | 0 refills | Status: AC
Start: 2018-02-18 — End: 2018-03-30

## 2018-02-18 NOTE — Telephone Encounter (Signed)
Will forward to MD for Approval    Last office visit 08/04/17    Future Appointments  Date Time Provider Department Center   02/26/2018 9:40 AM Azzie Glatter, DO [facility]         No Known Allergies

## 2018-02-26 ENCOUNTER — Ambulatory Visit: Payer: PRIVATE HEALTH INSURANCE | Primary: Student in an Organized Health Care Education/Training Program

## 2018-02-26 NOTE — Progress Notes (Deleted)
[facility]  Department of Internal Medicine  [facility]    Identification & History   Patient: Alexandra [name]   MRN: [id number]     Alexandra [name] is a 47 y.o. female with significant PMHx of HTN, chronic shoulder pain, and h/o HGSIL     Reason for Appointment:  - Follow up     Interval History/HPI:  - Last seen in clinic 08/2017 with shoulder pain.  She was referred to orthopedics who recommended PT.  Of note, PT was recommended by PCP previously but she declined.      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  It was recommended to follow up in one year.  No follow up has been done.  She will likely need to be referred back to OB/GYN.        Current Medications  Current Outpatient Prescriptions   Medication Sig    acetaminophen Take by mouth.    amLODIPine Take 1 tablet (10 mg total) by mouth daily.    naproxen TAKE ONE TABLET BY MOUTH TWICE A DAY WITH MEALS     No current facility-administered medications for this visit.        ROS     GEN: no recent fever, chills or night sweats   HEENT: no changes in vision, sore throat, rhinorrhea   CV: no cp, sob or orthopnea  Pulm: no cough or hemoptysis   GI: no nausea, vomiting, diarrhea or abdominal pain  GU: no bowel or bladder incontinence, no dysuria  MSK: no muscle or joint pain  Skin: no rashes, wounds or itching  Allergies: NKDA  Psych: no HI/SI    Physical Examination   There were no vitals taken for this visit.  Wt Readings from Last 3 Encounters:   08/04/17 (!) 240 lb (108.9 kg)   05/22/17 (!) 235 lb 9.6 oz (106.9 kg)   01/09/14 218 lb 11.2 oz (99.2 kg)     There is no height or weight on file to calculate BMI.    General appearance: Alert, awake, and oriented to person, place, and time. Cooperative, pleasant.  Head: Normocephalic, without obvious abnormality, atraumatic   Mouth: no oral ulcers or thrush   Neck: No carotid bruits, No JVD, trachea midline   Lungs: clear to auscultation bilaterally  and no respiratory distress, no wheezes/rales/rhonchi  Heart: regular rate and rhythm, S1, S2 normal, no murmurs/rubs/gallops  Abdomen: soft, non-tender non-distended; bowel sounds normal, no rebound or guarding  Extremities: no cyanosis/edema, dorsalis pedis pulses 2+ bilaterally  Skin: No rashes/lesions noted, no skin tears  Psych: good eye contact, normal affect    Assessment & Plan   Alexandra [name] is a 47 y.o. female with significant PMHx of HTN, chronic shoulder pain, and h/o HGSIL     Problem List Items Addressed This Visit     None        Hypertension:    BP Readings from Last 3 Encounters:   08/04/17 136/83   05/22/17 (!) 156/92   01/09/14 (!) 118/97   - Current regimen:  Amlodipine 5 mg     Shoulder pain:  X-ray 10/2017 with mild AC joint arthrosis bilaterally.          Orders and medications placed this encounter:  No orders of the defined types were placed in this encounter.       Azzie Glatter, DO  Internal Medicine, PGY-2  Pager, [phone]

## 2018-03-30 MED ORDER — naproxen (NAPROSYN) 500 MG tablet
500 | ORAL_TABLET | ORAL | 0 refills | Status: AC
Start: 2018-03-30 — End: 2020-03-21

## 2018-03-30 NOTE — Unmapped (Signed)
No future appointments.

## 2018-08-23 MED ORDER — amLODIPine (NORVASC) 10 MG tablet
10 | ORAL_TABLET | Freq: Every day | ORAL | 3 refills | Status: AC
Start: 2018-08-23 — End: 2020-02-17

## 2019-04-18 ENCOUNTER — Other Ambulatory Visit: Payer: Self-pay | Admitting: Family Medicine

## 2019-04-18 DIAGNOSIS — Z1231 Encounter for screening mammogram for malignant neoplasm of breast: Secondary | ICD-10-CM

## 2019-05-05 ENCOUNTER — Ambulatory Visit (INDEPENDENT_AMBULATORY_CARE_PROVIDER_SITE_OTHER): Payer: PRIVATE HEALTH INSURANCE

## 2019-05-05 ENCOUNTER — Other Ambulatory Visit: Payer: Self-pay

## 2019-05-05 DIAGNOSIS — Z1231 Encounter for screening mammogram for malignant neoplasm of breast: Secondary | ICD-10-CM | POA: Diagnosis not present

## 2019-11-15 IMAGING — MG DIGITAL SCREENING BILATERAL MAMMOGRAM WITH TOMO AND CAD
6 of 12 series · 6 of 36 positions shown · non-contrast
Comparison: Previous exam(s).

CLINICAL DATA: Screening.

EXAM:
DIGITAL SCREENING BILATERAL MAMMOGRAM WITH TOMO AND CAD

[L MLO synth-2D]
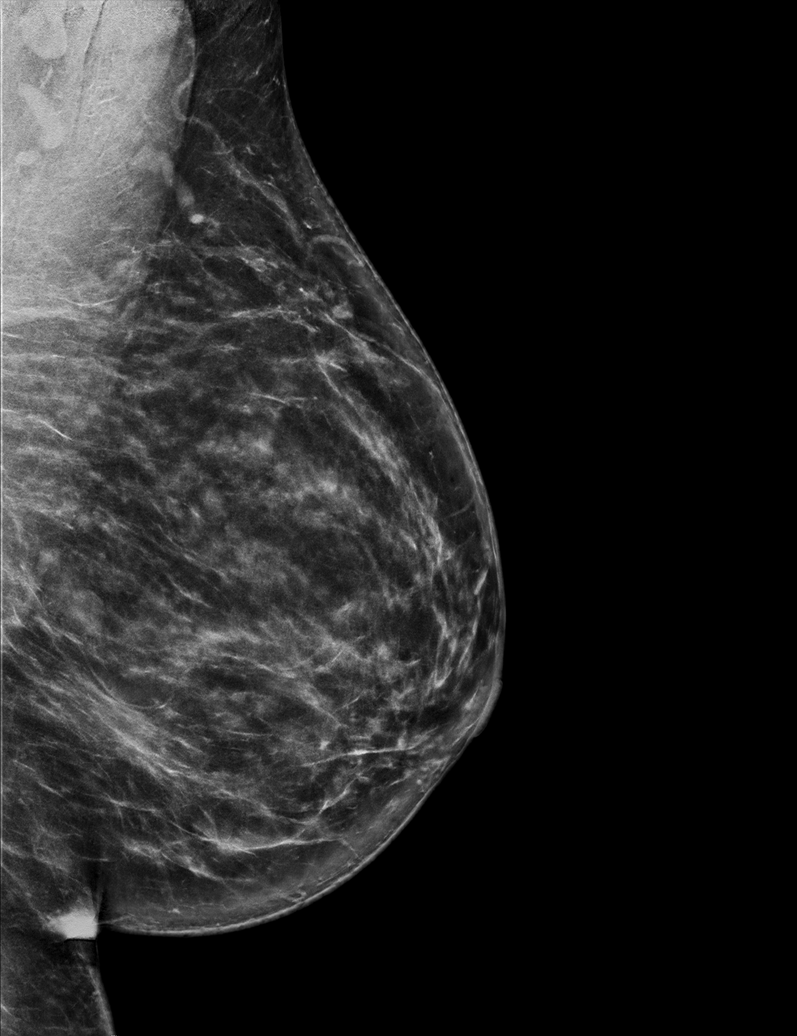

[R XCCL synth-2D]
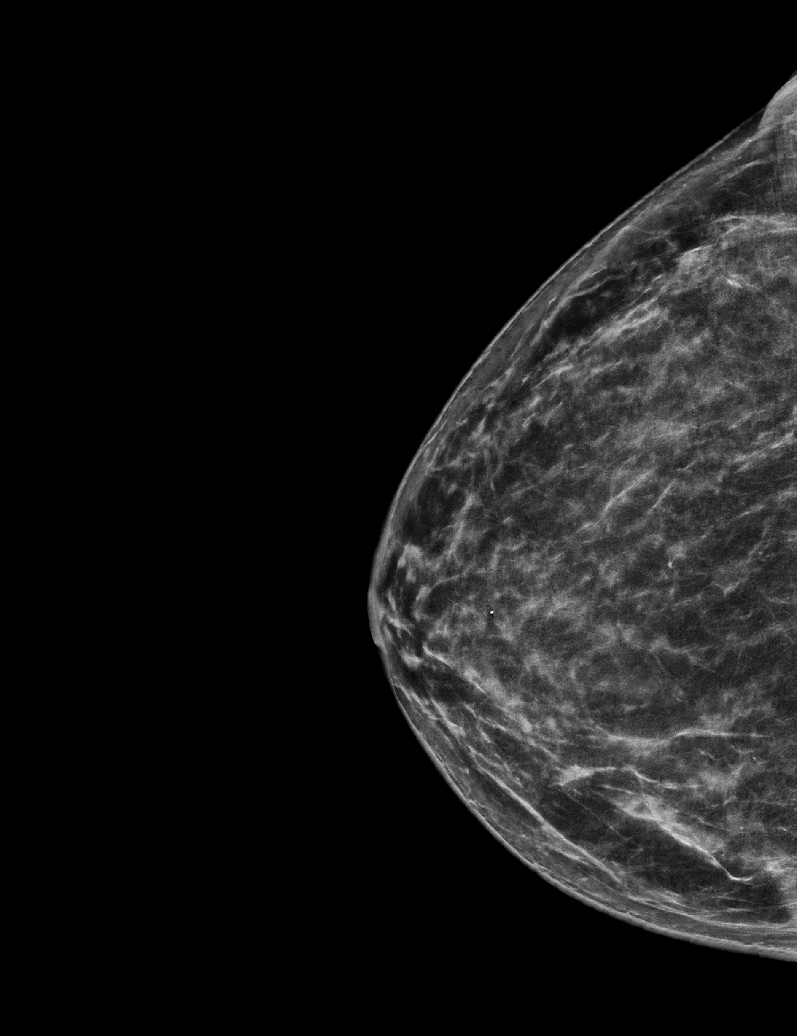

[R CC synth-2D]
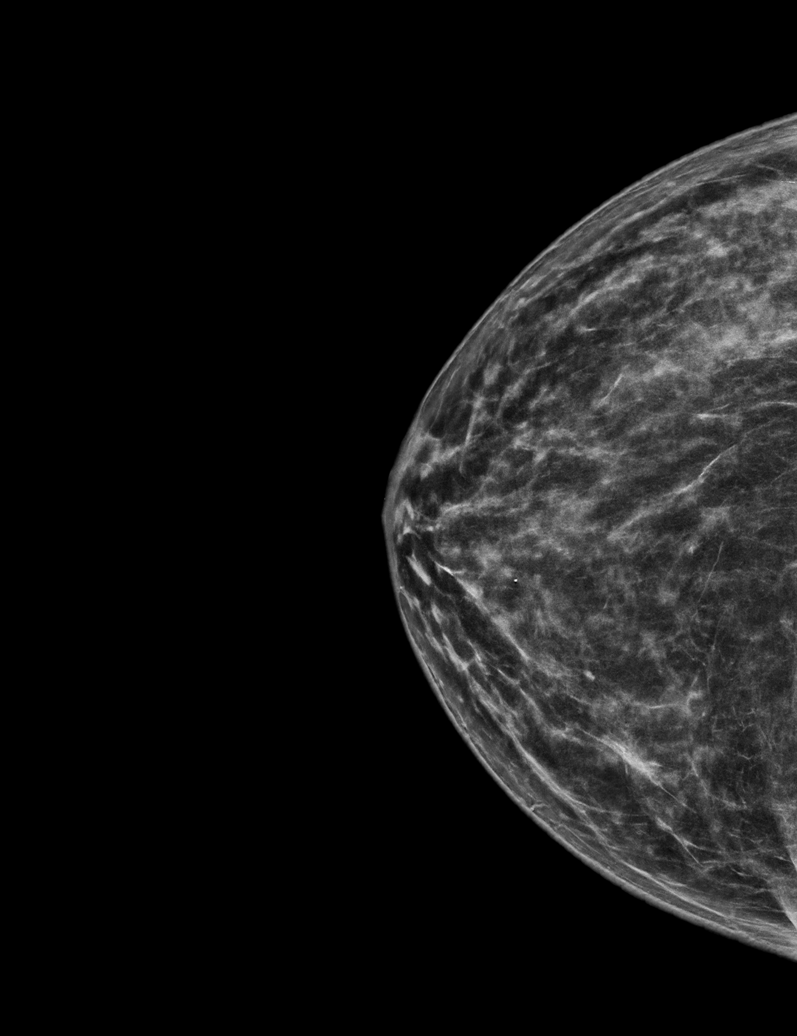

[L XCCL synth-2D]
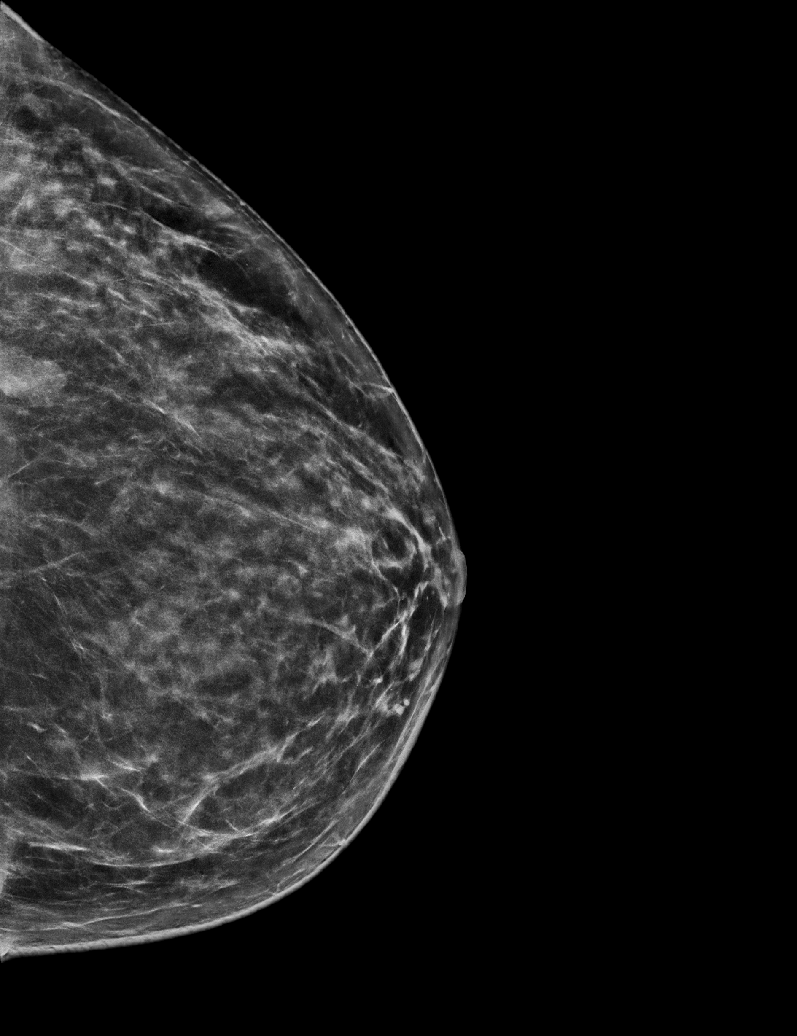

[L CC synth-2D]
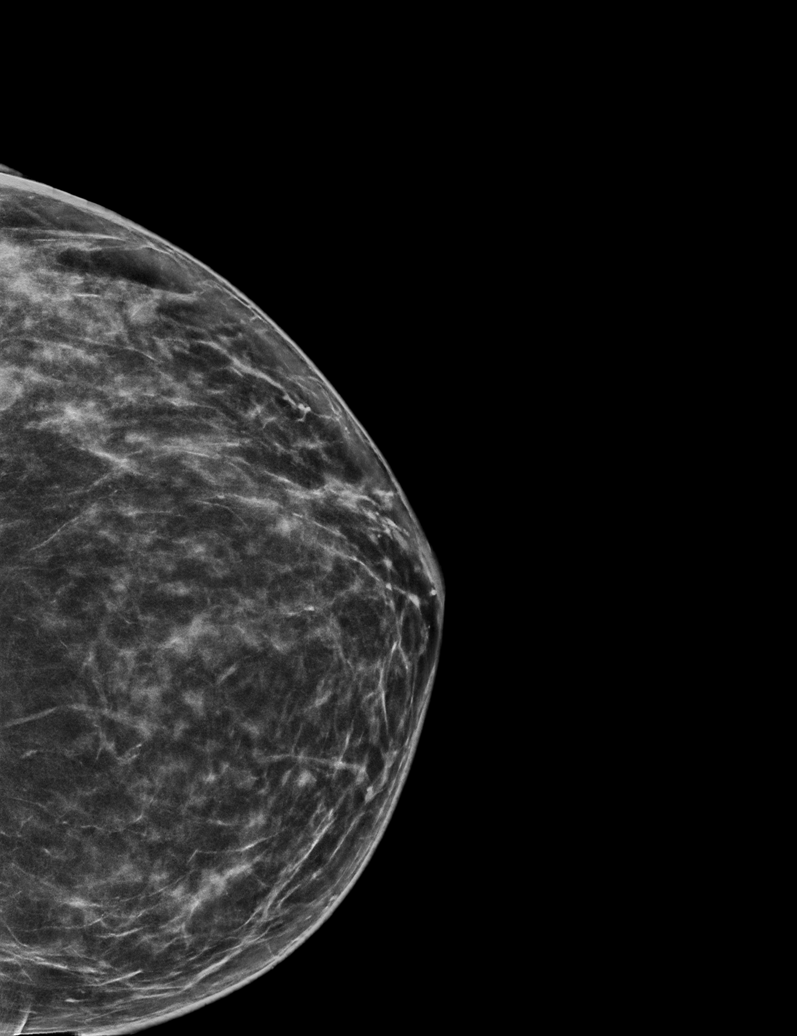

[R MLO synth-2D]
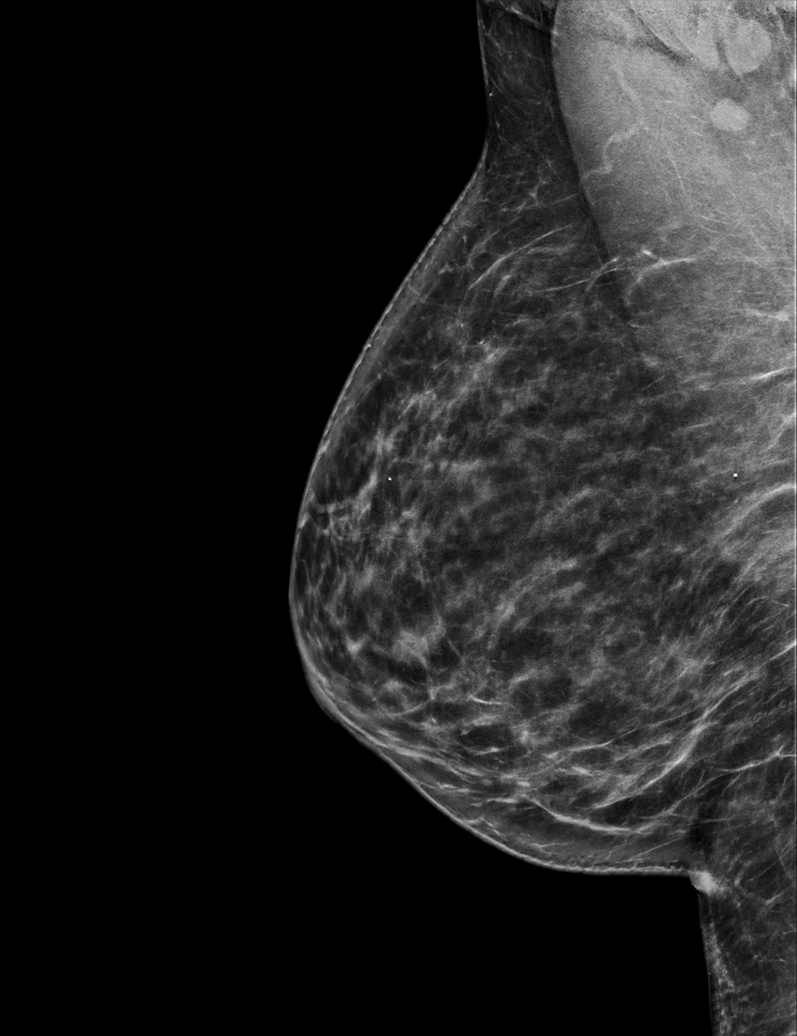

[6 of 36 positions shown; findings below may reference images not displayed]

ACR Breast Density Category c: The breast tissue is heterogeneously
dense, which may obscure small masses.
FINDINGS: There are no findings suspicious for malignancy. Images were
processed with CAD.
IMPRESSION: No mammographic evidence of malignancy. A result letter of this
screening mammogram will be mailed directly to the patient.

RECOMMENDATION:
Screening mammogram in one year. (Code:FT-U-LHB)

BI-RADS CATEGORY  1: Negative.

## 2020-02-17 ENCOUNTER — Ambulatory Visit
Admit: 2020-02-17 | Payer: PRIVATE HEALTH INSURANCE | Primary: Student in an Organized Health Care Education/Training Program

## 2020-02-17 ENCOUNTER — Ambulatory Visit
Admit: 2020-02-17 | Discharge: 2020-02-17 | Payer: PRIVATE HEALTH INSURANCE | Attending: Student in an Organized Health Care Education/Training Program | Primary: Student in an Organized Health Care Education/Training Program

## 2020-02-17 DIAGNOSIS — Z0000 Encounter for general adult medical examination without abnormal findings: Secondary | ICD-10-CM

## 2020-02-17 LAB — RENAL FUNCTION PANEL W/EGFR
Albumin: 4 g/dL (ref 3.5–5.7)
Anion Gap: 9 mmol/L (ref 3–16)
BUN: 14 mg/dL (ref 7–25)
CO2: 28 mmol/L (ref 21–33)
Calcium: 9.3 mg/dL (ref 8.6–10.3)
Chloride: 103 mmol/L (ref 98–110)
Creatinine: 0.78 mg/dL (ref 0.60–1.30)
Glucose: 83 mg/dL (ref 70–100)
Osmolality, Calculated: 290 mosm/kg (ref 278–305)
Phosphorus: 3.1 mg/dL (ref 2.1–4.7)
Potassium: 4.3 mmol/L (ref 3.5–5.3)
Sodium: 140 mmol/L (ref 133–146)
eGFR AA CKD-EPI: >90 See note.
eGFR NONAA CKD-EPI: 89 See note.

## 2020-02-17 LAB — LIPID PANEL
Cholesterol, Total: 156 mg/dL (ref 0–200)
HDL: 62 mg/dL (ref 60–92)
LDL Cholesterol: 57 mg/dL
Triglycerides: 184 mg/dL — ABNORMAL HIGH (ref 10–149)

## 2020-02-17 LAB — THYROID FUNCTION CASCADE: TSH: 1.7 u[IU]/mL (ref 0.45–4.12)

## 2020-02-17 LAB — HEPATITIS C ANTIBODY: HCV Ab: NONREACTIVE

## 2020-02-17 LAB — HEMOGLOBIN A1C: Hemoglobin A1C: 5.4 % (ref 4.0–5.6)

## 2020-02-17 MED ORDER — amLODIPine (NORVASC) 10 MG tablet
10 | ORAL_TABLET | Freq: Every day | ORAL | 3 refills | Status: AC
Start: 2020-02-17 — End: 2020-07-31

## 2020-02-17 NOTE — Unmapped (Signed)
-  PAP 2015 - needs repeat during next appt  ???? ?? 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  ???? ?? - Negative for high-grade dysplasia or malignancy.  -A1C, lipid profile, renal panel

## 2020-02-17 NOTE — Unmapped (Addendum)
Has been having shoulder pain for atleast 3 yrs. Feels as if she has trouble raising her arms above her shoulders. Was seen by ortho 3 yrs ago and imaging was negative so they recommended starting PT which she states was helping and had actually had led to cessation of shoulder pain but it came back 2-3 wks ago. Works for [employer]. Story not consistently w/ inflammatory process as her pain is only apparent during movement. Suspect more of a muscular strain  -PT referral  -Renal panel, if Cr ok, will prescribe naproxen prn  -TSH.

## 2020-02-17 NOTE — Unmapped (Signed)
Was on amlodipine 10mg  but per chart review, not refilled since 07/2019.  -Restart amlodipine

## 2020-02-17 NOTE — Unmapped (Signed)
I saw and examined the patient.  I discussed with the resident or fellow and agree with [facility] Margarita Grizzle, MD's findings and plan as documented in the note.    HPI:    48y/o F with pMhx HTN, obesity, shoulder pain who presents to re-establish care.    # Shoulder pain  - started 3years ago  - worse with movement, difficulty with lifting arms above shoulder level  - previously seen by Ortho -- negative Xrays at that time  - did have improvement in her pain with PT previously      # HTN  BP Readings from Last 3 Encounters:   02/17/20 (!) 159/106   08/04/17 136/83   05/22/17 (!) 156/92   - previously on Norvasc 10mg , but not taken in 7months      # CIN1  - noted on last Pap 2015      # Obesity  Wt Readings from Last 3 Encounters:   02/17/20 (!) 247 lb (112 kg)   08/04/17 (!) 240 lb (108.9 kg)   05/22/17 (!) 235 lb 9.6 oz (106.9 kg)   - has been increasing lately      PMHx, PSHx, SocHx, FamHx reviewed with resident    Physical Exam:  BP (!) 159/106 (BP Location: Right arm, Patient Position: Sitting, BP Cuff Size: Large)    Pulse 81    Temp 99 ??F (37.2 ??C) (Oral)    Ht 5' 2 (1.575 m)    Wt (!) 247 lb (112 kg)    LMP 12/20/2019 (Approximate)    BMI 45.18 kg/m??   General: alert, oriented, pleasant  Card: normal S1, S2 with regular rate and rhythm  Pulm: clear to auscultation  MSK: negative empty can testing      A&P:    # Shoulder pain  - possible rotator cuff strain  - refer PT  - Naproxen      # HTN  - restart Norvasc  - check labs: renal, A1c, lipid      # Obesity  - check TSH    # Health maintenance  Health Maintenance Due   Topic Date Due   ??? Hepatitis C Screening (MyChart)  Never done   ??? Diabetes Screening  Never done   ??? Comprehensive Physical Exam  Never done   ??? Immunization: COVID-19 (1) Never done   ??? Cervical Cancer Screening/Pap Smear (MyChart)  08/16/2014   ??? Renal Function/GFR  05/22/2018   - labs today  - schedule for COVID vaccine    Firman Petrow Tonny Branch, MD

## 2020-02-17 NOTE — Unmapped (Signed)
Thank you for coming in today!    Plan for this visit:  - Blood work today  - Physical therapy for pain    No future appointments.    Please call the office with any questions you may have.    Follow up in 1 month    If you do not know your schedule that far in advance, PLEASE schedule a TENTATIVE appointment date and you can always call to change or cancel your appointment date/time by calling the clinic at [phone].    Give your feedback! Your feedback is the most helpful information we receive to make changes within our practice. You can give feedback by completing surveys you receive via email or traditional mail, as well as by calling our patient experience department. Thank you for your help!

## 2020-02-17 NOTE — Unmapped (Addendum)
[facility]  New Patient Note       CC/Reason for visit:     Alexandra [name] is a 49 y.o. female with PMHx of  has a past medical history of Menarche and STD (sexually transmitted disease). who presents to establish care.    HPI   Alexandra [name] is a 49 yo female w/ PMH of HTN, morbid obesity, b/l shoulder pain who presents to re-establish care. Was last seen in 2018 but lost to follow-up since. Having shoulder pain for the past 2 wks. Also notes that she is having some sinus drainage. No rhinorrhea or tenderness on palpation. No fever, SOB. Has been ongoing for 2-3 wks. No rhinorrhea or conjunctivitis either.     Health maintainence due:  Health Maintenance Due   Topic Date Due   ??? Hepatitis C Screening (MyChart)  Never done   ??? Diabetes Screening  Never done   ??? Comprehensive Physical Exam  Never done   ??? Immunization: COVID-19 (1) Never done   ??? Cervical Cancer Screening/Pap Smear (MyChart)  08/16/2014   ??? Renal Function/GFR  05/22/2018     Focused Review of Systems   Constitutional: Negative for chills and fever.   HENT: Negative for sore throat.    Respiratory: Negative for cough and shortness of breath.    Cardiovascular: Negative for chest pain.   Gastrointestinal: Negative for nausea and vomiting.   Genitourinary: Negative for dysuria.   Musculoskeletal: Positive for joint pain and myalgias.     Medical, Surgical, Social and Family History:     Past Medical History:   Diagnosis Date   ??? Menarche     Age 59   ??? STD (sexually transmitted disease)     history of GC/trichomonas as teen     Past Surgical History:   Procedure Laterality Date   ??? CESAREAN SECTION  1991   ??? TUBAL LIGATION  1996     Social History     Socioeconomic History   ??? Marital status: Single     Spouse name: None   ??? Number of children: None   ??? Years of education: None   ??? Highest education level: None   Occupational History   ??? None   Social Needs   ??? Financial resource strain: None   ??? Food insecurity     Worry: None      Inability: None   ??? Transportation needs     Medical: None     Non-medical: None   Tobacco Use   ??? Smoking status: Never Smoker   ??? Smokeless tobacco: Never Used   Substance and Sexual Activity   ??? Alcohol use: No   ??? Drug use: No   ??? Sexual activity: Yes     Partners: Male     Birth control/protection: None   Lifestyle   ??? Physical activity     Days per week: None     Minutes per session: None   ??? Stress: None   Relationships   ??? Social Wellsite geologist on phone: None     Gets together: None     Attends religious service: None     Active member of club or organization: None     Attends meetings of clubs or organizations: None     Relationship status: None   ??? Intimate partner violence     Fear of current or ex partner: None     Emotionally abused: None  Physically abused: None     Forced sexual activity: None   Other Topics Concern   ??? Caffeine Use Yes     Comment: 2L every other day   ??? Occupational Exposure No   ??? Exercise Yes   ??? Seat Belt Yes   Social History Narrative   ??? None     Family History   Problem Relation Age of Onset   ??? Diabetes Father         NIDDM   ??? Diabetes Paternal Grandmother         IDDM   ??? Breast Cancer Neg Hx    ??? Colon Cancer Neg Hx    ??? Ovarian cancer Neg Hx      Current Outpatient Medications:     Current Outpatient Medications   Medication Sig   ??? amLODIPine Take 1 tablet (10 mg total) by mouth daily.   ??? naproxen TAKE ONE TABLET BY MOUTH TWICE A DAY WITH MEALS     No current facility-administered medications for this visit.      Objective:   BP (!) 159/106 (BP Location: Right arm, Patient Position: Sitting, BP Cuff Size: Large)    Pulse 81    Temp 99 ??F (37.2 ??C) (Oral)    Ht 5' 2 (1.575 m)    Wt (!) 247 lb (112 kg)    LMP 12/20/2019 (Approximate)    BMI 45.18 kg/m??     Wt Readings from Last 3 Encounters:   02/17/20 (!) 247 lb (112 kg)   08/04/17 (!) 240 lb (108.9 kg)   05/22/17 (!) 235 lb 9.6 oz (106.9 kg)     BP Readings from Last 5 Encounters:   02/17/20 (!) 159/106    08/04/17 136/83   05/22/17 (!) 156/92   01/09/14 (!) 118/97   12/16/13 (!) 152/106      Physical Exam   General: WD/WN, NAD, appears stated age, cooperative   HEENT: NCAT, No scleral icterus. EOMI. Mucous membranes moist.   Neck: Supple, trachea midline.  CV: RRR. Normal S1 and S2. No murmurs or extracardiac sounds.   Lungs: No increase in WOB. CTAB.   Abdomen: Soft, nontender, nondistended. Normal bowel sounds   Extremities: No edema, erythema, cyanosis, or deformity. Shoulder ROM on extension/flexion normal, decrease ROM b/l on shoulder abduction, pain on R shoulder during beer can test  Skin: Warm, dry. No bruising or rash noted.   Neuro: Alert and oriented x3. Speech normal. Moves UE and LE spontaneously.  Psych: Mood and affect appropriate for situation.     Screening Laboratory:   Lab Results   Component Value Date    HGB 12.6 05/22/2017    LDL 106 05/22/2017     The 10-year ASCVD risk score Denman George DC Jr., et al., 2013) is: 5.6%    Values used to calculate the score:      Age: 89 years      Sex: Female      Is Non-Hispanic African American: Yes      Diabetic: No      Tobacco smoker: No      Systolic Blood Pressure: 159 mmHg      Is BP treated: Yes      HDL Cholesterol: 68 mg/dL      Total Cholesterol: 192 mg/dL    Assessment & Plan:     Problems addressed this visit:    Chronic pain of both shoulders  Has been having shoulder pain for atleast 3 yrs. Feels as  if she has trouble raising her arms above her shoulders. Was seen by ortho 3 yrs ago and imaging was negative so they recommended starting PT which she states was helping and had actually had led to cessation of shoulder pain but it came back 2-3 wks ago. Works for [employer]. Story not consistently w/ inflammatory process as her pain is only apparent during movement. Suspect more of a muscular strain  -PT referral  -Renal panel, if Cr ok, will prescribe naproxen prn  -TSH.     Hypertension, essential  Was on amlodipine 10mg  but per chart review, not refilled  since 07/2019.  -Restart amlodipine    Healthcare maintenance  -PAP 2015 - needs repeat during next appt  ???? ?? 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  ???? ?? - Negative for high-grade dysplasia or malignancy.  -A1C, lipid profile, renal panel      Visit Orders Placed  Orders Placed This Encounter   ??? Renal Function Panel w/EGFR   ??? Hepatitis C Antibody   ??? Hemoglobin A1c   ??? Lipid Profile   ??? TSH, Reflex T4   ??? Physical Therapy Referral   ??? amLODIPine (NORVASC) 10 MG tablet       Follow up in 1 month for BP titration.    Future Appointments   Date Time Provider Department Center   02/18/2020 12:10 PM COVID VACCINE FIRST DOSE [facility]   03/21/2020  1:00 PM [facility] Margarita Grizzle, MD [facility]       Pre-visit planning performed.  Discussed self management and treatment goals with patient.   Barriers to meeting these goals were discussed.  Information regarding the usage and side effects of any new medication were provided.    Kaliel Bolds Margarita Grizzle, MD  PGY-2  02/17/2020  4:48 PM

## 2020-02-18 ENCOUNTER — Ambulatory Visit
Admit: 2020-02-18 | Discharge: 2020-02-18 | Payer: PRIVATE HEALTH INSURANCE | Primary: Student in an Organized Health Care Education/Training Program

## 2020-02-18 DIAGNOSIS — Z23 Encounter for immunization: Secondary | ICD-10-CM

## 2020-03-10 ENCOUNTER — Ambulatory Visit: Payer: PRIVATE HEALTH INSURANCE | Primary: Student in an Organized Health Care Education/Training Program

## 2020-03-21 ENCOUNTER — Ambulatory Visit
Payer: PRIVATE HEALTH INSURANCE | Attending: Student in an Organized Health Care Education/Training Program | Primary: Student in an Organized Health Care Education/Training Program

## 2020-03-21 MED ORDER — naproxen (NAPROSYN) 500 MG tablet
500 | ORAL_TABLET | Freq: Two times a day (BID) | ORAL | 0 refills | Status: AC
Start: 2020-03-21 — End: 2020-07-31

## 2020-03-21 NOTE — Unmapped (Signed)
Pt requesting prescription refill.       MEDICATIONS REQUESTED:     ?? naproxen (NAPROSYN) 500 MG tablet TAKE ONE TABLET BY MOUTH TWICE A DAY WITH MEALS    Last prescription date: 03/30/2018    Is the patient out of medication? yes     PHARMACY & PHONE #:   [facility] [address], [place] - [address]????[phone]    DATE OF LAST APPT: 02/17/20    DATE OF NEXT APPT: 04/06/20    Pt stated not taking medication in two years but now in need due to shoulder

## 2020-04-06 ENCOUNTER — Ambulatory Visit
Payer: PRIVATE HEALTH INSURANCE | Attending: Student in an Organized Health Care Education/Training Program | Primary: Student in an Organized Health Care Education/Training Program

## 2020-04-06 NOTE — Unmapped (Deleted)
[facility]  Follow Up Note       CC/Reason for visit:     Alexandra [name] is a 49 y.o. female with PMHx as below presenting for routine follow up  Past Medical History:   Diagnosis Date   ??? Menarche     Age 18   ??? STD (sexually transmitted disease)     history of GC/trichomonas as teen       Patient Active Problem List   Diagnosis   ??? 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 Hypertension, essential   ??? Chronic pain of both shoulders   ??? Healthcare maintenance   ??? Morbid obesity (CMS Dx)     HPI     Alexandra [name] is a 49 yo female w/ PMH of HTN, morbid obesity, b/l shoulder pain who presents for follow-up.    HTN  BP Readings from Last 3 Encounters:   02/17/20 (!) 159/106   08/04/17 136/83   05/22/17 (!) 156/92     - restarted amlodipine 10 at last visit    Chronic Shoulder Pain  - referred to PT and restarted naproxen at last visit    Health maintainence due:  Health Maintenance Due   Topic Date Due   ??? Comprehensive Physical Exam  Never done   ??? Cervical Cancer Screening/Pap Smear (MyChart)  08/16/2014   ??? Immunization: COVID-19 (2 - Pfizer 2-dose series) 03/10/2020     To do this visit:   ***    Focused ROS  Medical, Surgical, Social and Family History:     I have reviewed the patient???s medical history with the patient and updated any pertinent past medical, family and social history in the EMR.      Current Outpatient Medications:     Current Outpatient Medications   Medication Sig   ??? amLODIPine Take 1 tablet (10 mg total) by mouth daily.   ??? naproxen Take 1 tablet (500 mg total) by mouth 2 times a day with meals.     No current facility-administered medications for this visit.     Objective Data:   There were no vitals taken for this visit.    Physical Exam ***  General: WD/WN, NAD, appears stated age, cooperative ***  HEENT: NCAT, No scleral icterus. PERRL, EOMI. Mucous membranes moist. ***  Neck: Supple, trachea midline.  CV: Tachycardic /  ***RRR. Normal S1 and S2. No murmurs or extracardiac sounds.   Lungs: No increase in WOB. CTAB. ***  Abdomen: Soft, nontender, nondistended. Normal bowel sounds ***  Back/Flank: No TTP, No CVA tenderness to palpation ***  Extremities: No edema, erythema, cyanosis, or deformity ***  Skin: Warm, dry. No bruising or rash noted. ***  Pulses: 2+ radial/pedal*** pulses, symmetric  Neuro: Alert and oriented x3. Speech normal. Moves UE and LE spontaneously.  Psych: Mood and affect appropriate for situation.     Monofilament: {Monofilament Left:21624::Left foot intact}, {Monofilament Right:21625::Right foot intact}    Assessment & Plan:   Problems addressed this visit:    No problem-specific Assessment & Plan notes found for this encounter.      Visit Orders Placed  No orders of the defined types were placed in this encounter.      Follow up in *** for ***.    Future Appointments   Date Time Provider Department Center   04/06/2020  1:20 PM Zandra Abts, MD [facility]  Pre-visit planning performed.  Discussed self management and treatment goals with patient.   Barriers to meeting these goals were discussed.  Information regarding the usage and side effects of any new medication were provided.    Zandra Abts, MD  PGY-2  04/04/2020  11:16 AM

## 2020-04-26 ENCOUNTER — Ambulatory Visit
Admit: 2020-04-26 | Discharge: 2020-04-26 | Payer: PRIVATE HEALTH INSURANCE | Attending: Student in an Organized Health Care Education/Training Program | Primary: Student in an Organized Health Care Education/Training Program

## 2020-04-26 DIAGNOSIS — I10 Essential (primary) hypertension: Secondary | ICD-10-CM

## 2020-04-26 MED ORDER — fluticasone propionate (FLONASE) 50 mcg/actuation nasal spray
50 | Freq: Every day | NASAL | 0 refills | Status: AC
Start: 2020-04-26 — End: 2021-06-27

## 2020-04-26 NOTE — Unmapped (Signed)
This is a lady with HTN, chronic shoulder pain, Obesity (BMI 45) who comes for follow-up. BP is now at goal since start of amlodipine, which she is tolerating well. Discussed dietary changes.  I did not see or examine the patient. I discussed with the resident and agree with Zandra Abts, MD's findings and plan as documented in the resident's note.    Carie Caddy, MD

## 2020-04-26 NOTE — Unmapped (Signed)
BP Readings from Last 3 Encounters:   04/26/20 126/83   02/17/20 (!) 159/106   08/04/17 136/83     - cont amlodipine 10

## 2020-04-26 NOTE — Assessment & Plan Note (Signed)
-   due for pap. [facility]

## 2020-04-26 NOTE — Assessment & Plan Note (Signed)
-   nasal congestion of L nare  - flonase prn

## 2020-04-26 NOTE — Unmapped (Signed)
[facility]  Follow Up Note       CC/Reason for visit:     Alexandra [name] is a 49 y.o. female with PMHx as below presenting for routine follow up  Past Medical History:   Diagnosis Date   ??? Menarche     Age 42   ??? STD (sexually transmitted disease)     history of GC/trichomonas as teen       Patient Active Problem List   Diagnosis   ??? 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 Hypertension, essential   ??? Chronic pain of both shoulders   ??? Healthcare maintenance   ??? Morbid obesity (CMS Dx)     HPI     [name] is a 49 yo female w/ PMH of HTN, morbid obesity, b/l shoulder pain who presents for follow-up.    HTN  BP Readings from Last 3 Encounters:   04/26/20 126/83   02/17/20 (!) 159/106   08/04/17 136/83     - amlodipine 10  - BP better today    Health maintainence due:  Health Maintenance Due   Topic Date Due   ??? Comprehensive Physical Exam  Never done   ??? Cervical Cancer Screening/Pap Smear (MyChart)  08/16/2014   ??? Immunization: COVID-19 (2 - Pfizer 2-dose series) 03/10/2020     To do this visit:   - pap smear, 2nd covid shot    Focused Review of Systems   Constitutional: Negative for chills, fever, malaise/fatigue and weight loss.   HENT: Positive for congestion. Negative for sinus pain and sore throat.    Eyes: Negative for blurred vision and double vision.   Respiratory: Negative for cough, sputum production and shortness of breath.    Cardiovascular: Negative for chest pain, palpitations and leg swelling.   Gastrointestinal: Negative for abdominal pain, nausea and vomiting.   Genitourinary: Negative for dysuria, frequency and urgency.   Musculoskeletal: Negative for back pain, myalgias and neck pain.   Neurological: Negative for dizziness, weakness and headaches.     Medical, Surgical, Social and Family History:     I have reviewed the patient???s medical history with the patient and updated any pertinent past medical, family and social history  in the EMR.      Current Outpatient Medications:     Current Outpatient Medications   Medication Sig   ??? amLODIPine Take 1 tablet (10 mg total) by mouth daily.   ??? naproxen Take 1 tablet (500 mg total) by mouth 2 times a day with meals.   ??? fluticasone propionate Use 1 spray into each nostril daily.     No current facility-administered medications for this visit.     Objective Data:   BP 126/83    Pulse 67    Wt (!) 247 lb 14.4 oz (112.4 kg)    BMI 45.34 kg/m??     Physical Exam   General: WD/WN, NAD, appears stated age, cooperative   HEENT: NCAT, No scleral icterus. PERRL, EOMI. Mucous membranes moist. Nasal edema noted   Neck: Supple, trachea midline.  CV: RRR. Normal S1 and S2. No murmurs or extracardiac sounds.   Lungs: No increase in WOB. CTAB.   Abdomen: Soft, nontender, nondistended. Normal bowel sounds   Back/Flank: No TTP, No CVA tenderness to palpation   Extremities: No edema, erythema, cyanosis, or deformity   Skin: Warm, dry. No bruising or rash noted.   Neuro: Alert and oriented x3. Speech  normal. Moves UE and LE spontaneously.  Psych: Mood and affect appropriate for situation.     Assessment & Plan:   Problems addressed this visit:    Hypertension, essential  BP Readings from Last 3 Encounters:   04/26/20 126/83   02/17/20 (!) 159/106   08/04/17 136/83     - cont amlodipine 10    Healthcare maintenance  - due for pap. [facility]  - will get 2nd covid shot today    HGSIL on Pap Smear  - due for pap. [facility]      Visit Orders Placed  Orders Placed This Encounter   ??? fluticasone propionate (FLONASE) 50 mcg/actuation nasal spray       Follow up in 3 months for follow-up.    Future Appointments   Date Time Provider Department Center   05/25/2020  9:00 AM [facility]   07/31/2020  9:00 AM Zandra Abts, MD [facility]       Pre-visit planning performed.  Discussed self management and treatment goals with patient.    Barriers to meeting these goals were discussed.  Information regarding the usage and side effects of any new medication were provided.    Zandra Abts, MD  PGY-2  04/26/2020  11:48 AM

## 2020-04-26 NOTE — Unmapped (Signed)
Thank you for coming in today!    Plan for this visit:  - blood pressure is perfect today. Continue the amlodipine 10mg  daily  - can make an appointment with the [facility] for pap smear  - can go to [facility] and get 2nd covid shot    No future appointments.    Please call the office with any questions you may have.    Follow up in 3 months    If you do not know your schedule that far in advance, PLEASE schedule a TENTATIVE appointment date and you can always call to change or cancel your appointment date/time by calling the clinic at [phone].    Give your feedback! Your feedback is the most helpful information we receive to make changes within our practice. You can give feedback by completing surveys you receive via email or traditional mail, as well as by calling our patient experience department. Thank you for your help!

## 2020-04-26 NOTE — Assessment & Plan Note (Addendum)
-   due for pap. Will [facility]  - will get 2nd covid shot today

## 2020-05-25 ENCOUNTER — Ambulatory Visit: Payer: PRIVATE HEALTH INSURANCE | Primary: Student in an Organized Health Care Education/Training Program

## 2020-05-25 NOTE — Unmapped (Deleted)
[facility]  Follow Up Note       CC/Reason for visit:     Alexandra [name] is a 49 y.o. female with PMHx as below presenting for routine follow up***  Past Medical History:   Diagnosis Date   ??? Menarche     Age 7   ??? STD (sexually transmitted disease)     history of GC/trichomonas as teen       Patient Active Problem List   Diagnosis   ??? 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 Hypertension, essential   ??? Chronic pain of both shoulders   ??? Healthcare maintenance   ??? Morbid obesity (CMS Dx)   ??? Nasal congestion     HPI     HPI  49 yo female with pmhx of HTN, chronic shoulder pain, obesityLast clinic visit on ***. Today, pt ***    Menstrual history  LMP    OB Hx    PAP Hx  HGSIL on pap     Sexual History    Breast Hx    ROS  Vaginal Dryness/Discharge  Breast discharge/Masses  Dyspareunia  Irregular Bleeding   Rash/Lesions      Tobacco  Etoh  Drug      Health maintainence due:  Health Maintenance Due   Topic Date Due   ??? Comprehensive Physical Exam  Never done   ??? Cervical Cancer Screening/Pap Smear (MyChart)  08/16/2014   ??? Immunization: COVID-19 (2 - Pfizer 2-dose series) 03/10/2020   ??? Immunization: Zoster (1 of 2) Never done   ??? Mammogram (MyChart)  05/23/2020   ??? Colon Cancer Screening / Stool Testing  05/23/2020   ??? Colon Cancer Screening / Colonoscopy (MyChart)  05/23/2020     To do this visit:   ***    Focused ROS  Medical, Surgical, Social and Family History:     I have reviewed the patient???s medical history with the patient and updated any pertinent past medical, family and social history in the EMR.      Current Outpatient Medications:     Current Outpatient Medications   Medication Sig   ??? amLODIPine Take 1 tablet (10 mg total) by mouth daily.   ??? fluticasone propionate Use 1 spray into each nostril daily.   ??? naproxen Take 1 tablet (500 mg total) by mouth 2 times a day with meals.     No current facility-administered medications for  this visit.     Objective Data:   There were no vitals taken for this visit.    Physical Exam      Monofilament: {Monofilament Left:21624::Left foot intact}, {Monofilament Right:21625::Right foot intact}    Assessment & Plan:   Problems addressed this visit:    No problem-specific Assessment & Plan notes found for this encounter.      Visit Orders Placed  No orders of the defined types were placed in this encounter.      Follow up in *** for ***.    Future Appointments   Date Time Provider Department Center   05/25/2020  9:00 AM [facility]   07/31/2020  9:00 AM Zandra Abts, MD [facility]       Pre-visit planning performed.  Discussed self management and treatment goals with patient.   Barriers to meeting these goals were discussed.  Information regarding the usage and side effects of any new medication were provided.  [facility] PARRISH Marcio Hoque, DO  PGY-2  05/25/2020  7:57 AM

## 2020-06-14 ENCOUNTER — Ambulatory Visit
Admit: 2020-06-14 | Discharge: 2020-06-14 | Payer: PRIVATE HEALTH INSURANCE | Attending: Student in an Organized Health Care Education/Training Program | Primary: Student in an Organized Health Care Education/Training Program

## 2020-06-14 DIAGNOSIS — R87613 High grade squamous intraepithelial lesion on cytologic smear of cervix (HGSIL): Secondary | ICD-10-CM

## 2020-06-14 LAB — CHLAMYDIA/GONORRHOEAE DNA THIN PREP
Chlamydia Trachomatis DNA: NEGATIVE
Neisseria Gonorrhoeae DNA: NEGATIVE

## 2020-06-14 MED ORDER — hydroCHLOROthiazide (MICROZIDE) 12.5 mg capsule
12.5 | ORAL_CAPSULE | Freq: Every day | ORAL | 2 refills | 90.00000 days | Status: AC
Start: 2020-06-14 — End: 2020-07-31

## 2020-06-14 NOTE — Unmapped (Signed)
[facility]  Follow Up Note       CC/Reason for visit:     Alexandra [name] is a 49 y.o. female with PMHx as below presenting for pap smear  Past Medical History:   Diagnosis Date   ??? Menarche     Age 54   ??? STD (sexually transmitted disease)     history of GC/trichomonas as teen       Patient Active Problem List   Diagnosis   ??? 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 Hypertension, essential   ??? Chronic pain of both shoulders   ??? Healthcare maintenance   ??? Morbid obesity (CMS Dx)   ??? Nasal congestion     HPI     Alexandra [name] is a 49 yo female w/ PMH of HTN, morbid obesity, b/l shoulder pain??who presents for a pap smear.    Pap smear  - has history of HGSIL on last pap smear in 2015  - appt today just for repeat pap smear    HTN  BP Readings from Last 3 Encounters:   06/14/20 (!) 175/100   04/26/20 126/83   02/17/20 (!) 159/106     - prescribed amlodipine 10mg  daily, has not taken in last 3 days  - states that she increased her dose to amlodipine 20mg  at home due to her BPR still being elevated at home  - agreeable to starting HCTZ today  - no chest pain, HA, blurry vision    Health maintainence due:  Health Maintenance Due   Topic Date Due   ??? Comprehensive Physical Exam  Never done   ??? Cervical Cancer Screening/Pap Smear (MyChart)  08/16/2014   ??? Immunization: COVID-19 (2 - Pfizer 2-dose series) 03/10/2020   ??? Immunization: Zoster (1 of 2) Never done   ??? Mammogram (MyChart)  05/23/2020   ??? Colon Cancer Screening / Stool Testing  05/23/2020   ??? Colon Cancer Screening / Colonoscopy (MyChart)  05/23/2020     To do this visit:   - discuss covid vaccine    Focused Review of Systems   Constitutional: Negative for chills, fever and malaise/fatigue.   HENT: Negative for congestion and sinus pain.    Eyes: Negative for blurred vision and double vision.   Respiratory: Negative for cough, sputum production and shortness of breath.    Cardiovascular: Negative  for chest pain, palpitations and leg swelling.   Gastrointestinal: Negative for abdominal pain, constipation, diarrhea, nausea and vomiting.   Genitourinary: Negative for dysuria, frequency, hematuria and urgency.   Musculoskeletal: Negative for back pain, myalgias and neck pain.   Neurological: Negative for dizziness, weakness and headaches.     Medical, Surgical, Social and Family History:     I have reviewed the patient???s medical history with the patient and updated any pertinent past medical, family and social history in the EMR.      Current Outpatient Medications:     Current Outpatient Medications   Medication Sig   ??? amLODIPine Take 1 tablet (10 mg total) by mouth daily.   ??? naproxen Take 1 tablet (500 mg total) by mouth 2 times a day with meals.   ??? fluticasone propionate Use 1 spray into each nostril daily.   ??? hydroCHLOROthiazide Take 1 capsule (12.5 mg total) by mouth daily.     No current facility-administered medications for this visit.     Objective Data:   BP (!) 175/100 Comment: has not  had meds past 3 days   Pulse 66    Ht 5' 2 (1.575 m)    Wt (!) 246 lb (111.6 kg)    LMP 04/26/2020 (Approximate)    BMI 44.99 kg/m??     Physical Exam   General: WD/WN, NAD, appears stated age, cooperative   HEENT: NCAT, No scleral icterus. PERRL, EOMI. Mucous membranes moist.   Neck: Supple, trachea midline.  CV: RRR. Normal S1 and S2. No murmurs or extracardiac sounds.   Lungs: No increase in WOB. CTAB.   Abdomen: Soft, nontender, nondistended. Normal bowel sounds   Back/Flank: No TTP, No CVA tenderness to palpation   Extremities: No edema, erythema, cyanosis, or deformity   GU:  Skin: Warm, dry. No bruising or rash noted. symmetric  Neuro: Alert and oriented x3. Speech normal. Moves UE and LE spontaneously.  Psych: Mood and affect appropriate for situation.     Assessment & Plan:   Problems addressed this visit:    HGSIL on Pap Smear  - pap smear performed today. Will follow-up results    Hypertension,  essential  BP Readings from Last 3 Encounters:   06/14/20 (!) 175/100   04/26/20 126/83   02/17/20 (!) 159/106     - significantly elevated today, has been out of home amlodipine for several days  - will add HCTZ 12.5 and refill amlodipine. Advised patient to only take 10mg  of amlodipine  - will see back in one month for BP check      Visit Orders Placed  Orders Placed This Encounter   ??? Chlamydia/Gonorrhoeae DNA Thin Prep   ??? hydroCHLOROthiazide (MICROZIDE) 12.5 mg capsule       Follow up on 07/31/20    Future Appointments   Date Time Provider Department Center   07/31/2020  9:00 AM Zandra Abts, MD [facility]       Pre-visit planning performed.  Discussed self management and treatment goals with patient.   Barriers to meeting these goals were discussed.  Information regarding the usage and side effects of any new medication were provided.    Zandra Abts, MD  PGY-3  06/14/2020  11:34 AM

## 2020-06-14 NOTE — Progress Notes (Signed)
Assisted MD with pap, specimen collected, labeled and sent to lab as ordered.

## 2020-06-14 NOTE — Unmapped (Signed)
-   pap smear performed today. Will follow-up results

## 2020-06-14 NOTE — Unmapped (Signed)
BP Readings from Last 3 Encounters:   06/14/20 (!) 175/100   04/26/20 126/83   02/17/20 (!) 159/106     - significantly elevated today, has been out of home amlodipine for several days  - will add HCTZ 12.5 and refill amlodipine. Advised patient to only take 10mg  of amlodipine  - will see back in one month for BP check

## 2020-06-14 NOTE — Unmapped (Signed)
This is a lady with HTN, Obesity (BMI 45), hx of abnormal Pap who comes for follow-up. She was doubling up on her amlodipine for a while, then ran out. BP is elevated today off BP meds. Agree with need for 2 meds to accomplish goal of systolic of 130. Discuss goals and home BP monitoring. Recheck in office next month.  I did not see or examine the patient. I discussed with the resident and agree with Zandra Abts, MD's findings and plan as documented in the resident's note.    Carie Caddy, MD

## 2020-06-14 NOTE — Unmapped (Addendum)
Thank you for coming in today!    Plan for this visit:  - Pap smear performed, will call with results  - take the amlodipine 10mg  once a day. Will prescribe hydrochlorothiazide 12.5mg  once a day for your blood pressure.  - Goal blood pressure is 130/80. Continue taking your blood pressure at home and call if your blood pressure is still significantly elevated    Future Appointments   Date Time Provider Department Center   06/14/2020 10:20 AM Zandra Abts, MD [facility]   07/31/2020  9:00 AM Zandra Abts, MD [facility]       Please call the office with any questions you may have.    Follow up on 07/31/20 as scheduled    If you do not know your schedule that far in advance, PLEASE schedule a TENTATIVE appointment date and you can always call to change or cancel your appointment date/time by calling the clinic at [phone].    Give your feedback! Your feedback is the most helpful information we receive to make changes within our practice. You can give feedback by completing surveys you receive via email or traditional mail, as well as by calling our patient experience department. Thank you for your help!

## 2020-06-29 ENCOUNTER — Encounter: Payer: Self-pay | Admitting: *Deleted

## 2020-06-29 ENCOUNTER — Emergency Department (INDEPENDENT_AMBULATORY_CARE_PROVIDER_SITE_OTHER)
Admission: EM | Admit: 2020-06-29 | Discharge: 2020-06-29 | Disposition: A | Discharge status: Home / Self Care | Payer: PRIVATE HEALTH INSURANCE | Source: Home / Self Care

## 2020-06-29 ENCOUNTER — Other Ambulatory Visit: Payer: Self-pay

## 2020-06-29 DIAGNOSIS — M25551 Pain in right hip: Secondary | ICD-10-CM

## 2020-06-29 DIAGNOSIS — M546 Pain in thoracic spine: Secondary | ICD-10-CM

## 2020-06-29 DIAGNOSIS — R519 Headache, unspecified: Secondary | ICD-10-CM

## 2020-06-29 DIAGNOSIS — M545 Low back pain, unspecified: Secondary | ICD-10-CM

## 2020-06-29 DIAGNOSIS — M549 Dorsalgia, unspecified: Secondary | ICD-10-CM

## 2020-06-29 DIAGNOSIS — M62830 Muscle spasm of back: Secondary | ICD-10-CM

## 2020-06-29 DIAGNOSIS — S161XXA Strain of muscle, fascia and tendon at neck level, initial encounter: Secondary | ICD-10-CM

## 2020-06-29 MED ORDER — METHYLPREDNISOLONE ACETATE 80 MG/ML IJ SUSP
40.0000 mg | Freq: Once | INTRAMUSCULAR | Status: AC
  Administered 2020-06-29: 40 mg via INTRAMUSCULAR
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
## 2020-06-29 NOTE — Discharge Instructions (Signed)
  You may take the previously prescribed muscle relaxer and naproxen prescribed by your primary care provider for your pain. You may also take acetaminophen (Tylenol) 500mg  every 6 hours as needed for pain.   Call to schedule a follow up appointment with Primary Care or Sports Medicine at [facility] or [facility] next week if not improving.
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
## 2020-07-31 ENCOUNTER — Ambulatory Visit
Admit: 2020-07-31 | Discharge: 2020-07-31 | Payer: PRIVATE HEALTH INSURANCE | Attending: Student in an Organized Health Care Education/Training Program | Primary: Student in an Organized Health Care Education/Training Program

## 2020-07-31 DIAGNOSIS — Z0000 Encounter for general adult medical examination without abnormal findings: Secondary | ICD-10-CM

## 2020-07-31 MED ORDER — naproxen (NAPROSYN) 500 MG tablet
500 | ORAL_TABLET | Freq: Two times a day (BID) | ORAL | 0 refills | Status: AC
Start: 2020-07-31 — End: 2020-10-03

## 2020-07-31 MED ORDER — acetaminophen (TYLENOL EXTRA STRENGTH) 500 MG tablet
500 | ORAL_TABLET | Freq: Four times a day (QID) | ORAL | 2 refills | 8.00000 days | Status: AC | PRN
Start: 2020-07-31 — End: 2021-07-25

## 2020-07-31 MED ORDER — hydroCHLOROthiazide (HYDRODIURIL) 25 MG tablet
25 | ORAL_TABLET | Freq: Every day | ORAL | 3 refills | 90.00000 days | Status: AC
Start: 2020-07-31 — End: 2020-10-03

## 2020-07-31 MED ORDER — lidocaine (LMX) 4 % cream
4 | Freq: Three times a day (TID) | TOPICAL | 0 refills | 30.00000 days | Status: AC | PRN
Start: 2020-07-31 — End: 2020-10-03

## 2020-07-31 MED ORDER — lidocaine (LMX) 4 % cream
4 | TOPICAL | 0 refills | 30.00000 days | Status: AC | PRN
Start: 2020-07-31 — End: 2020-07-31

## 2020-07-31 MED ORDER — amLODIPine (NORVASC) 10 MG tablet
10 | ORAL_TABLET | Freq: Every day | ORAL | 3 refills | Status: AC
Start: 2020-07-31 — End: 2020-10-03

## 2020-07-31 NOTE — Unmapped (Signed)
-   pain c/w OA pain  - tylenol, naproxen, and lidocaine cream  - patient wishes to hold off of PT for now

## 2020-07-31 NOTE — Unmapped (Signed)
[facility]  Follow Up Note       CC/Reason for visit:     Alexandra [name] is a 49 y.o. female with PMHx as below presenting for routine follow up  Past Medical History:   Diagnosis Date   ??? Menarche     Age 89   ??? STD (sexually transmitted disease)     history of GC/trichomonas as teen       Patient Active Problem List   Diagnosis   ??? 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 Hypertension, essential   ??? Chronic pain of both shoulders   ??? Healthcare maintenance   ??? Morbid obesity (CMS Dx)   ??? Chronic pain of both knees     HPI     Alexandra [name] is a 48 yo female w/ PMH of HTN, morbid obesity, b/l shoulder pain??who presents for follow-up.    Bilateral Knee Pain  Bilateral Shoulder pain  - chronic in nature, has been ongoing intermittently for years  - knee pain is worse on stairs, shoulder pain worse on raising arms  - worked as Secretary/administrator and lifted patients regularly   - no weakness or loss of sensation, only taking naproxen for pain now    HTN  BP Readings from Last 3 Encounters:   07/31/20 140/76   06/14/20 (!) 175/100   04/26/20 126/83     - amlodipine 10 and HCTZ 12.5    Health maintainence due:  Health Maintenance Due   Topic Date Due   ??? Comprehensive Physical Exam  Never done   ??? Colon Cancer Screening / Stool Testing  Never done   ??? Colon Cancer Screening / Colonoscopy (MyChart)  Never done   ??? Immunization: COVID-19 (2 - Pfizer 2-dose series) 03/10/2020   ??? Mammogram (MyChart)  Never done   ??? Immunization: Zoster (1 of 2) Never done   ??? Immunization: Influenza (1) 07/04/2020     To do this visit:   - colonoscopy, mammogram, 2nd pfizer dose, flu shot    Focused Review of Systems   Constitutional: Negative for chills and fever.   HENT: Negative for congestion and sinus pain.    Eyes: Negative for blurred vision and double vision.   Respiratory: Negative for cough, sputum production and shortness of breath.    Cardiovascular: Negative for chest  pain, palpitations and leg swelling.   Gastrointestinal: Negative for abdominal pain, nausea and vomiting.   Genitourinary: Negative for dysuria, frequency and urgency.   Musculoskeletal: Positive for joint pain. Negative for back pain, falls and neck pain.   Neurological: Negative for dizziness and headaches.     Medical, Surgical, Social and Family History:     I have reviewed the patient???s medical history with the patient and updated any pertinent past medical, family and social history in the EMR.      Current Outpatient Medications:     Current Outpatient Medications   Medication Sig   ??? acetaminophen Take 1 tablet (500 mg total) by mouth every 6 hours as needed.   ??? amLODIPine Take 1 tablet (10 mg total) by mouth daily.   ??? fluticasone propionate Use 1 spray into each nostril daily.   ??? hydroCHLOROthiazide Take 1 tablet (25 mg total) by mouth daily.   ??? lidocaine Apply topically if needed.   ??? naproxen Take 1 tablet (500 mg total) by mouth 2 times a day with meals.     No  current facility-administered medications for this visit.     Objective Data:   BP 140/76 (BP Location: Right arm, Patient Position: Sitting, BP Cuff Size: Extra Large)    Pulse 70    Ht 5' 2 (1.575 m)    Wt (!) 245 lb (111.1 kg)    SpO2 100%    BMI 44.81 kg/m??     Physical Exam   General: WD/WN, NAD, appears stated age, cooperative   HEENT: NCAT, No scleral icterus. PERRL, EOMI. Mucous membranes moist.   Neck: Supple, trachea midline.  CV: RRR. Normal S1 and S2. No murmurs or extracardiac sounds.   Lungs: No increase in WOB. CTAB.   Abdomen: Soft, nontender, nondistended. Normal bowel sounds   Back/Flank: No TTP, No CVA tenderness to palpation   Extremities: TTP over anterior proximal tibias, normal ROM of bilateral knees and shoulders  Skin: Warm, dry. No bruising or rash noted.   Pulses: 2+ radial/pedal pulses, symmetric  Neuro: Alert and oriented x3. Speech normal. Moves UE and LE spontaneously.  Psych: Mood and affect appropriate for  situation.     Assessment & Plan:   Problems addressed this visit:    Hypertension, essential  BP Readings from Last 3 Encounters:   07/31/20 140/76   06/14/20 (!) 175/100   04/26/20 126/83     - amlodipine 10 and will increase HCTZ to 25    Chronic pain of both shoulders  - xrays ordered per patient request  - pain seems c/w arthritis  - will try tylenol, naproxen, lidocaine cream  - discussed PT, patient wishes to hold off for now    Chronic pain of both knees  - pain c/w OA pain  - tylenol, naproxen, and lidocaine cream  - patient wishes to hold off of PT for now      Visit Orders Placed  Orders Placed This Encounter   ??? Mammography Screening Bilateral incl CAD   ??? X-ray Shoulder Right min 2-views   ??? X-ray Shoulder Left min 2-views   ??? Influenza Vaccine (quadravalent, age < 46)   ??? Open Access Colonoscopy   ??? acetaminophen (TYLENOL EXTRA STRENGTH) 500 MG tablet   ??? lidocaine (LMX) 4 % cream   ??? naproxen (NAPROSYN) 500 MG tablet   ??? hydroCHLOROthiazide (HYDRODIURIL) 25 MG tablet   ??? amLODIPine (NORVASC) 10 MG tablet       Follow up in 3 months for follow-up.    No future appointments.    Pre-visit planning performed.  Discussed self management and treatment goals with patient.   Barriers to meeting these goals were discussed.  Information regarding the usage and side effects of any new medication were provided.    Zandra Abts, MD  PGY-3  07/31/2020  9:48 AM

## 2020-07-31 NOTE — Unmapped (Signed)
BP Readings from Last 3 Encounters:   07/31/20 140/76   06/14/20 (!) 175/100   04/26/20 126/83     - amlodipine 10 and will increase HCTZ to 25

## 2020-07-31 NOTE — Unmapped (Signed)
Here to follow-up HTN    Taking meds    Past medical, social, family, and surgical history reviewed.     BP borderline    A/P:  1) HTN - agree with increase Hydrochlorothiazide    Other as noted.    I did not see or examine the patient. I discussed with the resident and agree with Zandra Abts, MD's findings and plan as documented in the resident's note.    Rosine Beat, MD

## 2020-07-31 NOTE — Unmapped (Signed)
[facility] pharmacist called requesting the lidocaine frequency.      [facility] [address] [place], [place] - [address] AT [facility] [address]  [address]  [place] [address]  Phone: [phone]

## 2020-07-31 NOTE — Unmapped (Signed)
Thank you for coming in today!    Plan for this visit:  - will increase the dose of your hydrochlorothiazide to 25mg  once a day for your blood pressure  - knee and shoulder pain are most likely from arthritis, will prescribe tylenol (can alternate with naproxen) and lidocaine cream for use. If not improving then can try physical therapy  - ordered xrays of shoulders for pain, can call [phone] to schedule    No future appointments.    Please call the office with any questions you may have.    Follow up in 3 months    If you do not know your schedule that far in advance, PLEASE schedule a TENTATIVE appointment date and you can always call to change or cancel your appointment date/time by calling the clinic at [phone].    Give your feedback! Your feedback is the most helpful information we receive to make changes within our practice. You can give feedback by completing surveys you receive via email or traditional mail, as well as by calling our patient experience department. Thank you for your help!

## 2020-07-31 NOTE — Unmapped (Signed)
-   xrays ordered per patient request  - pain seems c/w arthritis  - will try tylenol, naproxen, lidocaine cream  - discussed PT, patient wishes to hold off for now

## 2020-08-20 ENCOUNTER — Inpatient Hospital Stay
Payer: PRIVATE HEALTH INSURANCE | Attending: Student in an Organized Health Care Education/Training Program | Primary: Student in an Organized Health Care Education/Training Program

## 2020-08-30 NOTE — Unmapped (Addendum)
Pt calling in requesting an appt for her bilateral shoulder pain, chronic. > 3 months.    Pt states they just dope me up on all these pills, I keep Orland Jarred' these pills. They need to figure out what is wrong.    Asked pt what she is taking, pt states can I just get an appt.    Advised pt this is NT and we are here to help get a better understanding and message for the provider who sees you.    Pt hung up.    Refusal triage  Pt has two xrays ordered from 9/28 that she did not get done  Please do not send back d/t refusal  Pt can have next available appt    Answer Assessment - Initial Assessment Questions  1. ONSET: When did the pain start?      for years    2. LOCATION: Where is the pain located?      *No Answer*  3. PAIN: How bad is the pain? (Scale 1-10; or mild, moderate, severe)    - MILD (1-3): doesn't interfere with normal activities    - MODERATE (4-7): interferes with normal activities (e.g., work or school) or awakens from sleep    - SEVERE (8-10): excruciating pain, unable to do any normal activities, unable to move arm at all due to pain      *No Answer*  4. WORK OR EXERCISE: Has there been any recent work or exercise that involved this part of the body?      *No Answer*  5. CAUSE: What do you think is causing the shoulder pain?      *No Answer*  6. OTHER SYMPTOMS: Do you have any other symptoms? (e.g., neck pain, swelling, rash, fever, numbness, weakness)      *No Answer*  7. PREGNANCY: Is there any chance you are pregnant? When was your last menstrual period?      *No Answer*    Protocols used: SHOULDER PAIN-A-[place]

## 2020-09-04 ENCOUNTER — Inpatient Hospital Stay
Payer: PRIVATE HEALTH INSURANCE | Attending: Student in an Organized Health Care Education/Training Program | Primary: Student in an Organized Health Care Education/Training Program

## 2020-10-03 ENCOUNTER — Inpatient Hospital Stay
Admit: 2020-10-03 | Discharge: 2020-10-07 | Payer: PRIVATE HEALTH INSURANCE | Attending: Student in an Organized Health Care Education/Training Program | Primary: Student in an Organized Health Care Education/Training Program

## 2020-10-03 ENCOUNTER — Ambulatory Visit
Admit: 2020-10-03 | Discharge: 2020-10-03 | Payer: PRIVATE HEALTH INSURANCE | Attending: Student in an Organized Health Care Education/Training Program | Primary: Student in an Organized Health Care Education/Training Program

## 2020-10-03 DIAGNOSIS — M25512 Pain in left shoulder: Secondary | ICD-10-CM

## 2020-10-03 DIAGNOSIS — M19011 Primary osteoarthritis, right shoulder: Secondary | ICD-10-CM

## 2020-10-03 MED ORDER — lidocaine (LMX) 4 % cream
4 | Freq: Three times a day (TID) | TOPICAL | 0 refills | 30.00000 days | Status: AC | PRN
Start: 2020-10-03 — End: 2021-06-27

## 2020-10-03 MED ORDER — hydroCHLOROthiazide (HYDRODIURIL) 25 MG tablet
25 | ORAL_TABLET | Freq: Every day | ORAL | 3 refills | 90.00000 days | Status: AC
Start: 2020-10-03 — End: 2021-05-10

## 2020-10-03 MED ORDER — naproxen (NAPROSYN) 500 MG tablet
500 | ORAL_TABLET | Freq: Two times a day (BID) | ORAL | 0 refills | Status: AC
Start: 2020-10-03 — End: 2021-05-13

## 2020-10-03 MED ORDER — NIFEdipine (ADALAT CC) 30 MG 24 hr tablet
30 | ORAL_TABLET | Freq: Every day | ORAL | 0 refills | Status: AC
Start: 2020-10-03 — End: 2021-05-10

## 2020-10-03 NOTE — Unmapped (Signed)
Thank you for coming in today!    Plan for this visit:  - Schedule appointment with ortho for evaluation of your shoulder  - Refilled medications sent to [facility]  - Stop amlodipine, start nifedipine  - Start monitoring blood pressure at home. Will call in 2 weeks to check in    No future appointments.    Please call the office with any questions you may have.    Follow up in 3-6 months    If you do not know your schedule that far in advance, PLEASE schedule a TENTATIVE appointment date and you can always call to change or cancel your appointment date/time by calling the clinic at [phone].    Give your feedback! Your feedback is the most helpful information we receive to make changes within our practice. You can give feedback by completing surveys you receive via email or traditional mail, as well as by calling our patient experience department. Thank you for your help!

## 2020-10-03 NOTE — Unmapped (Signed)
[facility]  INTERNAL MEDICINE RESIDENT PRACTICE  [address] [place] [address]    Name:  Alexandra [name]  Date of Birth: 1971/07/19 (49 y.o.)   MRN: [id number]    Date of Service:  10/03/2020     Subjective  Chief Complaint:     Follow Up    History of Present Illness:     Alexandra [name] is a 49 y.o. female with history of HTN, morbid obesity, b/l shoulder pain here for the following:    B/l shoulder pain: Right shoulder is more painful than the left. Having difficulty with bilateral ROM. Issues with sleeping secondary to pain. Obtained xrays this AM. Not interested in PT at this time. Having pain radiating to neck, back and arms. Main quality of pain is soreness. Sometimes hears it pop. No systemic symptoms, other joint pains. No pain in jaw, changes in vision. This has been going on for years. Naproxen/tylenol not helping significantly.     HTN:  BP Readings from Last 3 Encounters:   10/03/20 (!) 149/99   07/31/20 140/76   06/14/20 (!) 175/100     Think shoulder pain is contributing to elevated blood pressure. Has been taking amlo 10 and HCTZ 25 daily. Has not been taking home pressures but has a monitor at home. Will start taking in future. Follow up over phone in 2 weeks.    HM: outstanding HM ordered at previous visit, has not been completed yet. Interested in getting second Covid shot and will go to pharmacy after. Re referred to colonoscopy and patient reports she will call regarding mammogram.    Health Maintenance Due   Topic Date Due   ??? Colon Cancer Screening / Stool Testing  Never done   ??? Colon Cancer Screening / Colonoscopy (MyChart)  Never done   ??? Immunization: COVID-19 (2 - Pfizer 3-dose booster series) 03/10/2020   ??? Mammogram (MyChart)  Never done   ??? Immunization: Zoster (1 of 2) Never done   ??? Immunization: Influenza (1) Never done        Current Outpatient Medications:  Current Outpatient Medications   Medication Sig Dispense Refill   ??? acetaminophen (TYLENOL EXTRA STRENGTH)  500 MG tablet Take 1 tablet (500 mg total) by mouth every 6 hours as needed. 90 tablet 2   ??? fluticasone propionate (FLONASE) 50 mcg/actuation nasal spray Use 1 spray into each nostril daily. 16 g 0   ??? hydroCHLOROthiazide (HYDRODIURIL) 25 MG tablet Take 1 tablet (25 mg total) by mouth daily. 90 tablet 3   ??? lidocaine (LMX) 4 % cream Apply topically 3 times a day as needed. 45 g 0   ??? naproxen (NAPROSYN) 500 MG tablet Take 1 tablet (500 mg total) by mouth 2 times a day with meals. 60 tablet 0   ??? NIFEdipine (ADALAT CC) 30 MG 24 hr tablet Take 1 tablet (30 mg total) by mouth daily. 30 tablet 0     No current facility-administered medications for this visit.       ROS:     As noted in HPI    Objective:     Vitals:    10/03/20 1025   BP: (!) 149/99   Pulse: 67        There is no height or weight on file to calculate BMI.    Physical Exam  Constitutional:       General: She is not in acute distress.     Appearance: She is obese.  She is not diaphoretic.   Eyes:      General: No scleral icterus.     Extraocular Movements: Extraocular movements intact.      Conjunctiva/sclera: Conjunctivae normal.   Cardiovascular:      Rate and Rhythm: Normal rate and regular rhythm.      Pulses: Normal pulses.      Heart sounds: No murmur heard.  No friction rub. No gallop.    Pulmonary:      Effort: Pulmonary effort is normal.      Breath sounds: Normal breath sounds.   Abdominal:      Palpations: Abdomen is soft.      Tenderness: There is no abdominal tenderness.   Musculoskeletal:         General: No deformity.      Cervical back: Normal range of motion.      Right lower leg: No edema.      Left lower leg: No edema.      Comments: Reduced ROM of right arm, normal of left. Pain with abduction of right arm from sides. Significant pain with internal rotation of right arm. Needed to use left arm to help raise right arm above head to perform external rotation, but able to at that point. Normal biceps and triceps strength of right arm.     Skin:     General: Skin is warm and dry.      Coloration: Skin is not jaundiced.   Neurological:      Mental Status: She is alert and oriented to person, place, and time.      Cranial Nerves: No facial asymmetry.   Psychiatric:         Mood and Affect: Mood normal.         Behavior: Behavior normal.         Assessment/Plan:     Alexandra [name] is a 49 y.o. female presenting for the following:     Alexandra [name] was seen today for shoulder pain and shoulder injury.    Diagnoses and all orders for this visit:    Chronic pain of both shoulders (Primary)  Assessment & Plan:  Patient with bilateral shoulder pain but significantly worse on right. Has thus far not responded to conservative management. Requires significant NSAIDs, reports that PT exacerbated the pain in the past, not willing to try again. Xrays obtained today but awaiting results. Given significant pain with internal rotation and decreased ROM of right arm, concern for development of rotator cuff pathology. Will refer to ortho for further evaluation, consideration of joint injection or further imaging.    Orders:  -     lidocaine (LMX) 4 % cream; Apply topically 3 times a day as needed.  -     Orthopedics Referral  -     naproxen (NAPROSYN) 500 MG tablet; Take 1 tablet (500 mg total) by mouth 2 times a day with meals.    Hypertension, essential  Assessment & Plan:  Patient with elevated BP not at goal despite amlodipine 10, HCTZ 25. Will attempt to exchange amlo for nifedipine 30 given more room for uptitration. Will call patient to assess home BP on nifedipine and uptitrate as tolerated.    Orders:  -     hydroCHLOROthiazide (HYDRODIURIL) 25 MG tablet; Take 1 tablet (25 mg total) by mouth daily.  -     NIFEdipine (ADALAT CC) 30 MG 24 hr tablet; Take 1 tablet (30 mg total) by mouth daily.    Healthcare maintenance  Assessment & Plan:  Patient amenable to obtaining covid shot today. Will work on further immunizations upcoming. Patient amenable to colonoscopy when  discussed CC screening options. Also counseled regarding mammogram. Orders or previous orders placed for all.    Orders:  -     Colonoscopy, Open Access       Return in about 3 months (around 01/01/2021) for In Office Visit.    No future appointments.    Pre-visit planning performed.  Discussed self management and treatment goals with patient.   Barriers to meeting these goals were discussed.  Information regarding the usage and side effects of any new medication were provided.       Vivianne Spence, MD

## 2020-10-03 NOTE — Unmapped (Signed)
I did not see or examine the patient. I discussed with the resident and agree with Vivianne Spence, MD's findings and plan as documented in the resident's note.      Hx fo HTN, shoulder pain    shoudler pain  Chronic  R>L  Got xray this morning  Pain is constant  Decreased ROM  Has done PT with no relief   Will have see ortho    HTN  Repeat BP   Does not want more pills, consiedr switching to nifidipien     Mir[employer], DO

## 2020-10-03 NOTE — Unmapped (Signed)
Patient with elevated BP not at goal despite amlodipine 10, HCTZ 25. Will attempt to exchange amlo for nifedipine 30 given more room for uptitration. Will call patient to assess home BP on nifedipine and uptitrate as tolerated.

## 2020-10-03 NOTE — Unmapped (Signed)
Patient with bilateral shoulder pain but significantly worse on right. Has thus far not responded to conservative management. Requires significant NSAIDs, reports that PT exacerbated the pain in the past, not willing to try again. Xrays obtained today but awaiting results. Given significant pain with internal rotation and decreased ROM of right arm, concern for development of rotator cuff pathology. Will refer to ortho for further evaluation, consideration of joint injection or further imaging.

## 2020-10-03 NOTE — Unmapped (Signed)
Patient amenable to obtaining covid shot today. Will work on further immunizations upcoming. Patient amenable to colonoscopy when discussed CC screening options. Also counseled regarding mammogram. Orders or previous orders placed for all.

## 2020-10-04 NOTE — Unmapped (Signed)
Called to discuss xray with patient, but reached voicemail. 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 Patient was referred to ortho at visit yesterday, and is still the plan after these results. No change in current plan.    Vivianne Spence, MD  Internal Medicine, PGY-2  5:17 PM, 10/04/2020

## 2020-10-09 NOTE — Unmapped (Signed)
Pt returned call and was read the msg.     States understanding.     FYI - no further action needed

## 2020-10-11 ENCOUNTER — Ambulatory Visit: Payer: PRIVATE HEALTH INSURANCE | Primary: Student in an Organized Health Care Education/Training Program

## 2020-10-18 ENCOUNTER — Ambulatory Visit: Payer: PRIVATE HEALTH INSURANCE | Primary: Student in an Organized Health Care Education/Training Program

## 2020-10-23 ENCOUNTER — Ambulatory Visit
Payer: PRIVATE HEALTH INSURANCE | Attending: Sports Medicine | Primary: Student in an Organized Health Care Education/Training Program

## 2020-10-29 ENCOUNTER — Ambulatory Visit
Admit: 2020-10-29 | Discharge: 2020-10-29 | Payer: PRIVATE HEALTH INSURANCE | Primary: Student in an Organized Health Care Education/Training Program

## 2020-10-29 DIAGNOSIS — M25511 Pain in right shoulder: Secondary | ICD-10-CM

## 2020-10-29 MED ORDER — diclofenac sodium (VOLTAREN) 1 % gel
1 | Freq: Four times a day (QID) | TOPICAL | 0 refills | Status: AC
Start: 2020-10-29 — End: 2020-11-05

## 2020-10-29 NOTE — Unmapped (Signed)
Chief Complaint   Patient presents with   ??? New Patient Visit/ Consultation     Bilateral shoulder pain       HPI:      Alexandra [name] is a 49 y.o. female who presents for evaluation of bilateral shoulder pain. Reports off and on pain for the last 8 years however she has noticed pain worsening throughout this past year. Right shoulder is more painful than left. Denies any injury. Pain is mostly anterior, worsens with overhead motion, and radiates down to hand bilaterally. Endorsing clicking and popping sensation to right shoulder. She has been taking ibuprofen and tylenol for the pain. Reports prior PT roughly 4 years ago but none recently. No prior steroid injections.      The notes and documentation from the physician extender were reviewed, verified, and edited if necessary.           Past Medical History:   Diagnosis Date   ??? Menarche     Age 31   ??? STD (sexually transmitted disease)     history of GC/trichomonas as teen       Past Surgical History:   Procedure Laterality Date   ??? CESAREAN SECTION  1991   ??? TUBAL LIGATION  1996        Family History   Problem Relation Age of Onset   ??? Diabetes Father         NIDDM   ??? Diabetes Paternal Grandmother         IDDM   ??? Breast Cancer Neg Hx    ??? Colon Cancer Neg Hx    ??? Ovarian cancer Neg Hx        Medication  Current Outpatient Medications   Medication Sig Dispense Refill   ??? acetaminophen (TYLENOL EXTRA STRENGTH) 500 MG tablet Take 1 tablet (500 mg total) by mouth every 6 hours as needed. 90 tablet 2   ??? fluticasone propionate (FLONASE) 50 mcg/actuation nasal spray Use 1 spray into each nostril daily. 16 g 0   ??? hydroCHLOROthiazide (HYDRODIURIL) 25 MG tablet Take 1 tablet (25 mg total) by mouth daily. 90 tablet 3   ??? lidocaine (LMX) 4 % cream Apply topically 3 times a day as needed. 45 g 0   ??? naproxen (NAPROSYN) 500 MG tablet Take 1 tablet (500 mg total) by mouth 2 times a day with meals. 60 tablet 0   ??? NIFEdipine (ADALAT CC) 30 MG 24 hr tablet Take 1 tablet (30 mg  total) by mouth daily. 30 tablet 0     No current facility-administered medications for this visit.       Allergies  No Known Allergies    Review of Systems:  Pertinent items are noted in HPI.  A complete 10 system ROS was reviewed and was otherwise negative for pertinent issues.    Physical Examination:        Vitals:    10/29/20 1010   Weight: (!) 245 lb (111.1 kg)   Height: 5' 2 (1.575 m)       Physical Exam  Musculoskeletal:      Comments: Right Shoulder: TTP anterior shoulder, full forward flexion, pain with resistance to rotator cuff muscles especially with ER and abduction however 5/5 strength + cross body, + jobes, + neer.     Left Shoulder: TTP to anterior shoulder, 5/5 strength with resisted rotator cuff muscle motion.            Radiology:     3  view X-rays of the bilateral shoulders dated 10/29/2020 were reviewed independently and discussed with the patient.  The films revealed: No acute fracture or dislocation.     Assessment:     1. Acute pain of both shoulders         Plan:    Alexandra [name] is a 49 y.o. female with bilateral shoulder pain however right is more painful than left. After exam I feel that her symptoms are 2/2 shoulder impingement vs partial rotator cuff tear. Her overall strength is quite good however she does have pain with resistance. XR negative for significant arthritis to explain symptoms. We discussed the diagnosis and potential treatment options. At this time recommending physical therapy to help with strength and ROM. Will also prescribe topical diclofenac gel for pain. The patient voices understanding and all of her questions were answered.         Follow-up: 4- 6 weeks to reassess prognosis.  Sooner with any problems, questions, concerns, or worsening symptoms.

## 2020-10-29 NOTE — Unmapped (Signed)
This office note has been dictated.

## 2020-11-05 MED ORDER — diclofenac sodium (VOLTAREN) 1 % gel
1 | TOPICAL | 0 refills | Status: AC
Start: 2020-11-05 — End: 2021-02-25

## 2020-11-08 NOTE — Unmapped (Signed)
DICLOFENAC SODIUM GEL 1% APPRIVED THROUGH [facility] MEDS REF # [id number]. PHARMACY AND PATIENT NOTIFIED

## 2020-11-26 ENCOUNTER — Ambulatory Visit: Payer: PRIVATE HEALTH INSURANCE | Primary: Student in an Organized Health Care Education/Training Program

## 2020-12-10 ENCOUNTER — Ambulatory Visit: Payer: PRIVATE HEALTH INSURANCE | Primary: Student in an Organized Health Care Education/Training Program

## 2020-12-21 ENCOUNTER — Ambulatory Visit: Payer: PRIVATE HEALTH INSURANCE | Primary: Student in an Organized Health Care Education/Training Program

## 2020-12-24 ENCOUNTER — Ambulatory Visit
Admit: 2020-12-24 | Discharge: 2020-12-24 | Payer: PRIVATE HEALTH INSURANCE | Primary: Student in an Organized Health Care Education/Training Program

## 2020-12-24 DIAGNOSIS — M25511 Pain in right shoulder: Secondary | ICD-10-CM

## 2020-12-24 MED ORDER — diclofenac (VOLTAREN) 50 MG EC tablet
50 | ORAL_TABLET | Freq: Two times a day (BID) | ORAL | 0 refills | Status: AC
Start: 2020-12-24 — End: 2021-02-25

## 2020-12-24 NOTE — Unmapped (Signed)
[facility] SPORTS [facility]ICINE    PATIENT NAME:      Alexandra [name], Alexandra [name]                  MRN:                 [id number]  DATE OF BIRTH:     January 05, 1971                    CSN:                 [id number]  PROVIDER:          Skip Estimable, MD                 VISIT DATE:          12/24/2020                                   OFFICE NOTE      HISTORY OF PRESENT ILLNESS:  Ms. Vankirk is a 50 year old female, following up for right shoulder pain as well as some left shoulder pain.  We last saw her about 8 weeks ago.  She has been in physical therapy for the past 8 weeks without significant   improvement.  Continues to complain of right greater than left, pretty significant pain in the right shoulder.  No numbness, tingling.  No weakness.  No neck pain with this.  She used diclofenac cream as well as over-the-counter medications as well.    PHYSICAL EXAMINATION:  Here on evaluation, she has some weakness with testing of the supraspinatus, positive impingement, positive Neer's and Hawkins testing.  She has otherwise good range of motion of the shoulder.  No erythema.  No warmth.    PLAN:  At this point in time, we are failing conservative treatment from her standpoint.  She has done 8 weeks of physical therapy without relief.  Has done anti-inflammatories and over-the-counter medications as well as prescribed diclofenac cream for   her.  Still no improvement and continued to have issues.  We are going to obtain an MRI of the right shoulder to evaluate for rotator cuff tear.  Possible need for surgical intervention if she fails therapy at this point in time.        Cashay Manganelli Tyrell Antonio, MD      BB/AQ  DD:?? 12/24/2020 11:19:50  DT:?? 12/25/2020 01:06:06    JOB#:  [id number]

## 2020-12-24 NOTE — Unmapped (Signed)
This office note has been dictated.

## 2020-12-26 NOTE — Unmapped (Signed)
Status: Approved Auth# [id number]  Effective Dates: 12/26/2020 to 02/24/2021  Faxed referral to [facility]. They will contact patient and get her scheduled. I will contact her and get her scheduled for a follow up.

## 2020-12-26 NOTE — Unmapped (Signed)
Status: Pending [id number]  Request initiated on Rad Md, clinicals uploaded. When authorization is obtained referral will be faxed to [facility]. They will contact patient and get her scheduled. I will contact patient and get her scheduled for a follow up.

## 2021-01-15 ENCOUNTER — Ambulatory Visit
Payer: PRIVATE HEALTH INSURANCE | Attending: Student in an Organized Health Care Education/Training Program | Primary: Student in an Organized Health Care Education/Training Program

## 2021-01-15 NOTE — Unmapped (Deleted)
[facility]  INTERNAL MEDICINE RESIDENT PRACTICE  [address] [place] [address]    Name:  Alexandra [name]  Date of Birth: 10-Oct-1971 (50 y.o.)   MRN: [id number]    Date of Service:  01/14/2021     Subjective  Chief Complaint:     BP follow up    HPI/Assessment/Plan:     Alexandra [name] is a 50 y.o. female with history of HTN, morbid obesity, b/l shoulder pain here for the following:    - ***    Chronic pain of both shoulders (Primary)  Assessment & Plan:  Patient with bilateral shoulder pain but significantly worse on right. Has thus far not responded to conservative management. Requires significant NSAIDs, reports that PT exacerbated the pain in the past, not willing to try again. Xrays obtained today but awaiting results. Given significant pain with internal rotation and decreased ROM of right arm, concern for development of rotator cuff pathology. Will refer to ortho for further evaluation, consideration of joint injection or further imaging.  ??  Orders:  -     lidocaine (LMX) 4 % cream; Apply topically 3 times a day as needed.  -     Orthopedics Referral  -     naproxen (NAPROSYN) 500 MG tablet; Take 1 tablet (500 mg total) by mouth 2 times a day with meals.  ??  Hypertension, essential  Assessment & Plan:  Patient with elevated BP not at goal despite amlodipine 10, HCTZ 25. Will attempt to exchange amlo for nifedipine 30 given more room for uptitration. Will call patient to assess home BP on nifedipine and uptitrate as tolerated.    Switched back to amlodipine. Stopped nifedipine***  ??  Orders:  -     hydroCHLOROthiazide (HYDRODIURIL) 25 MG tablet; Take 1 tablet (25 mg total) by mouth daily.  -     NIFEdipine (ADALAT CC) 30 MG 24 hr tablet; Take 1 tablet (30 mg total) by mouth daily.  ??  Healthcare maintenance  Assessment & Plan:  Patient amenable to obtaining covid shot today. Will work on further immunizations upcoming. Patient amenable to colonoscopy when discussed CC screening options. Also  counseled regarding mammogram. Orders or previous orders placed for all.  ??  Orders:  -     Colonoscopy, Open Access    Leave results on voicemail/Mychart/neither? ***  Health Maintenance Due   Topic Date Due   ??? Colorectal Cancer Screening  Never done   ??? Immunization: COVID-19 (2 - Pfizer 3-dose series) 03/10/2020   ??? Mammogram (MyChart)  Never done   ??? Immunization: Zoster (1 of 2) Never done   ??? Immunization: Influenza (1) Never done       There are no diagnoses linked to this encounter.    Current Outpatient Medications:  Current Outpatient Medications   Medication Sig Dispense Refill   ??? acetaminophen (TYLENOL EXTRA STRENGTH) 500 MG tablet Take 1 tablet (500 mg total) by mouth every 6 hours as needed. 90 tablet 2   ??? diclofenac (VOLTAREN) 50 MG EC tablet Take 1 tablet (50 mg total) by mouth 2 times a day. 60 tablet 0   ??? diclofenac sodium (VOLTAREN) 1 % gel APPLY 4 GRAMS TOPICALLY FOUR TIMES A DAY 100 g 0   ??? fluticasone propionate (FLONASE) 50 mcg/actuation nasal spray Use 1 spray into each nostril daily. 16 g 0   ??? hydroCHLOROthiazide (HYDRODIURIL) 25 MG tablet Take 1 tablet (25 mg total) by mouth daily. 90 tablet 3   ???  lidocaine (LMX) 4 % cream Apply topically 3 times a day as needed. 45 g 0   ??? naproxen (NAPROSYN) 500 MG tablet Take 1 tablet (500 mg total) by mouth 2 times a day with meals. 60 tablet 0   ??? NIFEdipine (ADALAT CC) 30 MG 24 hr tablet Take 1 tablet (30 mg total) by mouth daily. 30 tablet 0     No current facility-administered medications for this visit.       ROS:     As noted in HPI    Objective:   There were no vitals filed for this visit.     There is no height or weight on file to calculate BMI.    Physical Exam***    Follow Up:     No follow-ups on file.    Future Appointments   Date Time Provider Department Center   01/15/2021  8:00 AM Vivianne Spence, MD [facility]   01/18/2021 12:45 PM Rolanda Lundborg, MD [facility]       Pre-visit planning performed.  Discussed self management and  treatment goals with patient.   Barriers to meeting these goals were discussed.  Information regarding the usage and side effects of any new medication were provided.       Vivianne Spence, MD

## 2021-01-18 ENCOUNTER — Ambulatory Visit
Admit: 2021-01-18 | Discharge: 2021-01-18 | Payer: PRIVATE HEALTH INSURANCE | Primary: Student in an Organized Health Care Education/Training Program

## 2021-01-18 DIAGNOSIS — M75101 Unspecified rotator cuff tear or rupture of right shoulder, not specified as traumatic: Secondary | ICD-10-CM

## 2021-01-18 MED ORDER — bupivacaine HCl (MARCAINE) 0.5 % (5 mg/mL) injection 20 mg
0.5 | Freq: Once | INTRAMUSCULAR | Status: AC
Start: 2021-01-18 — End: 2021-01-18
  Administered 2021-01-18: 19:00:00 20 mL via SUBCUTANEOUS

## 2021-01-18 MED ORDER — lidocaine 10 mg/mL (1 %) injection 4 mL
10 | Freq: Once | INTRAMUSCULAR | Status: AC
Start: 2021-01-18 — End: 2021-01-18
  Administered 2021-01-18: 19:00:00 4 mL via SUBCUTANEOUS

## 2021-01-18 MED ORDER — betamethasone acetate-betamethasone sodium phosphate (CELESTONE) injection 12 mg
6 | Freq: Once | INTRAMUSCULAR | Status: AC
Start: 2021-01-18 — End: 2021-01-18
  Administered 2021-01-18: 19:00:00 12 mg via INTRA_ARTICULAR

## 2021-01-18 NOTE — Unmapped (Signed)
This office note has been dictated.    Per provider's instructions, the patient was prepped for an injection - as well as drug and  allergies were reviewed.   The appropriate medication was drawn, and documented.  A medical assistant was also present to assist with the injection.   The patient was then educated on what to expect following the injection.   All questions were answered.    A verbal consent was obtained and a verbal timeout was performed on 01/18/2021 at 1:05 PM.     ??  Tene Gato, MA

## 2021-01-18 NOTE — Unmapped (Signed)
Procedure Performed:  Subacromial Bursa Steroid Injection - Right    Anesthesia:  local    Specimen Removed:  none    Complications:  none    PROCEDURE IN DETAIL:    After the patient's chart was reviewed, informed consent was obtained to including a discussion of risks, including infection, bleeding etc were explained and alternative options discussed.  The patient elected to proceed with injection    The appropriate extremity and site of procedure was identified and verified with the patient.    The site was then prepped and draped in usual sterile fashion utilizing aseptic solution.  The patient was seated upright.  The subacromial space was identified by palpating the posterolateral border of the acromion.  Ethyl Chloride was used to anesthetize the skin.  The needle was introduced through the approximately 2 mm below the acromion, running parallel with the undersurface of the acromion, into the subacromial space.   A 10 ml mixture of 4 ml 1% lidocaine, 4 ml 0.5% marcaine and 2ml (6 mg) betamethasone was injected without resistance using a 25 gauge 1 1/2 inch needle.  The needle was withdrawn from the site and hemostasis was obtained by direct pressure and site cleaned with alcohol.  A band aide was applied to the site.  The patient tolerated the procedure well without complications.      Derrious Bologna

## 2021-01-20 NOTE — Unmapped (Signed)
[facility] AND SPORTS MEDICINE    PATIENT NAME:      Alexandra [name], Alexandra [name]                  MRN:                 [id number]  DATE OF BIRTH:     August 28, 1971                    CSN:                 [id number]  PROVIDER:          Skip Estimable, MD                 VISIT DATE:          01/18/2021                                   OFFICE NOTE      REASON FOR VISIT:  Followup right shoulder pain.    [name] follows up today for continued evaluation of right shoulder pain.  She had been to PT, but was failing PT and not getting any improvement, so at her last visit, we ordered an MRI.  MRI of the right shoulder comes back with an interstitial   delamination tear of the supraspinatus that is 1 cm x 1 cm with no atrophy.  She also has filling defects with hypertrophic synovitis and loose bodies in the shoulder.    PHYSICAL EXAMINATION:  Here on my evaluation, she has great range of motion of the shoulder with no evidence of adhesive capsulitis.  She has very mild weakness of the supraspinatus with a positive empty can, positive Hawkins, but no frank weakness.  At this point in time, we   discussed multiple options.  I think she is actually doing fairly well, but continues to have pain.  MRI does show interstitial tear of the rotator cuff.  We discussed multiple options.  Did discuss surgical intervention and surgical options as well, but   she wants to hold on that.  I think we can try a subacromial injection which we did perform here today.  Hopefully, she will get some relief from that.  We will have her follow up in 4 to 6 weeks.  If no significant relief, I may need to have her   evaluated for surgical intervention.  We will see her back in 4 to 6 weeks.        Journee Kohen Tyrell Antonio, MD      BB/AQ  DD:?? 01/20/2021 14:26:32  DT:?? 01/20/2021 15:05:18    JOB#:  [id number]

## 2021-02-15 ENCOUNTER — Ambulatory Visit: Payer: PRIVATE HEALTH INSURANCE | Primary: Student in an Organized Health Care Education/Training Program

## 2021-02-25 ENCOUNTER — Ambulatory Visit
Admit: 2021-02-25 | Discharge: 2021-02-25 | Payer: PRIVATE HEALTH INSURANCE | Primary: Student in an Organized Health Care Education/Training Program

## 2021-02-25 DIAGNOSIS — M25511 Pain in right shoulder: Secondary | ICD-10-CM

## 2021-02-25 MED ORDER — diclofenac (VOLTAREN) 50 MG EC tablet
50 | ORAL_TABLET | Freq: Two times a day (BID) | ORAL | 0 refills | Status: AC
Start: 2021-02-25 — End: 2021-07-25

## 2021-02-25 NOTE — Unmapped (Signed)
This office note has been dictated.

## 2021-02-25 NOTE — Unmapped (Signed)
Chief Complaint   Patient presents with   ??? Follow-up     Right shoulder       HPI:      Alexandra [name] is a 50 y.o. female who presents for follow-up evaluation of right shoulder pain.  The patient was last seen on 02/15/2021.Subacromial injection performed at that visit. MRI of her shoulder significant for interstitial delamination tear of supraspinatus that is 1cm x1cm with no atrophy. She also has filling defects with hypertrophic synovitis and loose bodies in the shoulder.     Since the last visit, Alexandra [name] has noted no significant improvement in pain. Pain is anterior, non radiating, worsens with any overhead or lifting motion. Reports that steroid injection helped for 2 weeks but no returned. Denies any new injury. Denies any new numbness or tingling. She has been going to physical therapy with minimal improvement.     The notes and documentation from the physician extender were reviewed, verified, and edited if necessary.    Medication  Current Outpatient Medications   Medication Sig Dispense Refill   ??? acetaminophen (TYLENOL EXTRA STRENGTH) 500 MG tablet Take 1 tablet (500 mg total) by mouth every 6 hours as needed. 90 tablet 2   ??? diclofenac (VOLTAREN) 50 MG EC tablet Take 1 tablet (50 mg total) by mouth 2 times a day. 60 tablet 0   ??? fluticasone propionate (FLONASE) 50 mcg/actuation nasal spray Use 1 spray into each nostril daily. 16 g 0   ??? hydroCHLOROthiazide (HYDRODIURIL) 25 MG tablet Take 1 tablet (25 mg total) by mouth daily. 90 tablet 3   ??? lidocaine (LMX) 4 % cream Apply topically 3 times a day as needed. 45 g 0   ??? naproxen (NAPROSYN) 500 MG tablet Take 1 tablet (500 mg total) by mouth 2 times a day with meals. 60 tablet 0   ??? NIFEdipine (ADALAT CC) 30 MG 24 hr tablet Take 1 tablet (30 mg total) by mouth daily. 30 tablet 0     No current facility-administered medications for this visit.       Allergies  No Known Allergies    Physical Examination:        Vitals:    02/25/21 1058   Weight: (!)  245 lb (111.1 kg)   Height: 5' 2 (1.575 m)       Physical Exam  Musculoskeletal:      Comments: Right shoulder: TTP along anterior shoulder, pain with forward flexion but full range of motion present. She has weakness of supraspinatus with + empty can and hawkins.                   Radiology:    No new imaging to review this visit    Assessment:    1. Chronic right shoulder pain       Plan:     Overall, the patient is poorly progressing despite steroid injection as well as physical therapy. Discussed possible referral for surgical invention however patient would like to try more PT before seeing surgeon. We refilled diclofenac which does help her symptoms.       Treatment plan per EPIC orders.    Follow-up 3 weeks to reassess prognosis.  Sooner with any problems, questions, concerns, or worsening symptoms.

## 2021-03-25 ENCOUNTER — Ambulatory Visit: Payer: PRIVATE HEALTH INSURANCE | Primary: Student in an Organized Health Care Education/Training Program

## 2021-04-05 ENCOUNTER — Ambulatory Visit
Admit: 2021-04-05 | Discharge: 2021-04-05 | Payer: PRIVATE HEALTH INSURANCE | Primary: Student in an Organized Health Care Education/Training Program

## 2021-04-05 DIAGNOSIS — M7541 Impingement syndrome of right shoulder: Secondary | ICD-10-CM

## 2021-04-05 NOTE — Unmapped (Signed)
This office note has been dictated.

## 2021-04-05 NOTE — Unmapped (Signed)
[facility] AND SPORTS MEDICINE    PATIENT NAME:      Alexandra [name], Alexandra [name]                  MRN:                 [id number]  DATE OF BIRTH:     1971-02-08                    CSN:                 [id number]  PROVIDER:          Skip Estimable, MD                 VISIT DATE:          04/05/2021                                   OFFICE NOTE      CHIEF COMPLAINT:  Right shoulder supraspinatus tear and subacromial impingement.    HISTORY OF PRESENT ILLNESS:  Alexandra [name] comes in today for further evaluation for right shoulder.  We have actually been seeing her for the past 6 months or so.  MRI shows a 1 cm x 1 cm interstitial supraspinatus tear.  Alexandra [name] also has loose bodies and   synovitis.  Alexandra [name] has been on anti-inflammatories and has done extensive physical therapy as well without improvement.  Alexandra [name] in fact thinks it is getting worse.  Alexandra [name] states Alexandra [name] really wants to think about surgery now given her failure of other conservative   options.  We talked about injections multiple times, but Alexandra [name] really does not want injection.  Does not feel like it would get better with that and wants to get a little bit more definitive as far as treatment.    PHYSICAL EXAMINATION:  Here on evaluation, Alexandra [name] does have positive impingement testing.  Alexandra [name] has no real weakness with the supraspinatus, infraspinatus, subscapularis or teres minor.  Alexandra [name] has good range of motion of the shoulder, but with positive Neer and impingement testing.    Some mild pain with crossover as well.    IMAGING:  I reviewed the MRI as well as x-rays.  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  Has some acromial spurring as well as some mild glenohumeral arthritis.    PLAN:  Discussed with her that we may not get the shoulder completely back to where it was prior, but Alexandra [name] is very adamant that Alexandra [name] wants evaluation for surgical consideration.  May benefit from surgical subacromial decompression.  So at this  point in   time, we will have her make an appointment with Dr. Henreitta Cea at her convenience.  All this was discussed with her.  Alexandra [name] will follow up as needed.        Dorothey Oetken Tyrell Antonio, MD      BB/AQ  DD:?? 04/05/2021 13:59:54  DT:?? 04/06/2021 03:29:08    JOB#:  [id number]

## 2021-04-17 ENCOUNTER — Ambulatory Visit
Payer: PRIVATE HEALTH INSURANCE | Attending: Rehabilitative and Restorative Service Providers" | Primary: Student in an Organized Health Care Education/Training Program

## 2021-04-22 ENCOUNTER — Inpatient Hospital Stay: Payer: PRIVATE HEALTH INSURANCE | Primary: Student in an Organized Health Care Education/Training Program

## 2021-04-24 ENCOUNTER — Ambulatory Visit
Admit: 2021-04-24 | Discharge: 2021-04-24 | Payer: PRIVATE HEALTH INSURANCE | Attending: Rehabilitative and Restorative Service Providers" | Primary: Student in an Organized Health Care Education/Training Program

## 2021-04-24 DIAGNOSIS — M75111 Incomplete rotator cuff tear or rupture of right shoulder, not specified as traumatic: Secondary | ICD-10-CM

## 2021-04-24 NOTE — Unmapped (Signed)
[facility]  [place] [facility]    ORTHOPEDIC SURGERY AND SPORTS MEDICINE    JANICIA MONTERROSA  1971/08/20    Date of Visit:   04/24/2021    Chief Complaint: Right Shoulder Pain    History:  Alexandra [name] is a 50 y.o. female right shoulder pain. Patient is a transfer of care from Dr. Tyrell Antonio. Patient has an MRI that shows a small tear to supraspinatus. She has tried and failed conservative treatment including physical therapy and injections. Patient here today to discuss operative treatment. Patient stated shoulder pain began several months back. Patient denies any specific mechanism of injury. 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 Pain is located  Anterior/posterior shoulder and does not radiate. No significant alleviating factors. Pain is worse with any movement at this point. Patient denies any numbness and tingling distally.     Past Medical History:  Past Medical History:   Diagnosis Date   ??? Menarche     Age 53   ??? STD (sexually transmitted disease)     history of GC/trichomonas as teen       Past Surgical History:  Past Surgical History:   Procedure Laterality Date   ??? CESAREAN SECTION  1991   ??? TUBAL LIGATION  1996       Allergies:  No Known Allergies    Medications:  Current Outpatient Medications   Medication Sig   ??? acetaminophen Take 1 tablet (500 mg total) by mouth every 6 hours as needed.   ??? diclofenac Take 1 tablet (50 mg total) by mouth 2 times a day.   ??? fluticasone propionate Use 1 spray into each nostril daily.   ??? hydroCHLOROthiazide Take 1 tablet (25 mg total) by mouth daily.   ??? lidocaine Apply topically 3 times a day as needed.   ??? naproxen Take 1 tablet (500 mg total) by mouth 2 times a day with meals.   ??? NIFEdipine Take 1 tablet (30 mg total) by mouth daily.     No current facility-administered medications for this visit.       Social History:  Social History     Socioeconomic History   ??? Marital status: Single     Spouse name: Not on file   ??? Number of children: Not on file    ??? Years of education: Not on file   ??? Highest education level: Not on file   Occupational History   ??? Not on file   Tobacco Use   ??? Smoking status: Never Smoker   ??? Smokeless tobacco: Never Used   Substance and Sexual Activity   ??? Alcohol use: No   ??? Drug use: No   ??? Sexual activity: Yes     Partners: Male     Birth control/protection: None   Other Topics Concern   ??? Caffeine Use Yes     Comment: 2L every other day   ??? Occupational Exposure No   ??? Exercise Yes   ??? Seat Belt Yes   Social History Narrative   ??? Not on file     Social Determinants of Health     Financial Resource Strain: Not on file   Physical Activity: Not on file   Stress: Not on file   Social Connections: Not on file   Housing Stability: Not on file       Review of systems: A complete review of systems was performed today per the intake form, which is recorded in EPIC.  This  was personally reviewed by me and pertinent positives are noted above in the history.    Physical Exam:  General: Patient appears well nourished, stated age and is in no acute distress.  Neuro/Psych: Patient is alert and oriented times 3 with an appropriate mood and affect.  Skin: Examination of the patient???s skin shows no rashes, ecchymosis, or abrasions.    Neck: No tenderness to palpation over the midline.  Full range of motion in flexion, extension, rotation, & lateral bend without pain.  Negative Spurling???s sign.    Right Shoulder:    ?? Palpation: tender anterior and posterior shoulder   ?? Range of motion:  160 of forward flexion, 70 of external rotation, & T10 of internal rotation  ?? Strength: 3+/5 in abduction in the scapular plane with pain, 3+/5 in external rotation with pain, 5/5 in internal rotation with belly press testing  ?? positive impingement signs with Neer & Hawkins test  ?? negative Speed???s test; negative Yergason???s test  ?? negative O???Brien???s test  ?? No signs of instability  ?? Neurovascularly intact to gross inspection distally.      Left Shoulder:    There is  no tenderness to palpation about the shoulder.  The shoulder has full range of motion to 160 of forward flexion, 70 of external rotation, and T10 of internal rotation.  There is 5/5 strength in forward flexion, abduction, internal rotation, and external rotation.  Hawkins and Neer impingement tests are negative.  Speed???s, Yergason, & O???Brien tests are negative.  There are no signs of instability.  The arm is neurovascularly intact to gross inspection distally.      Films:    My independent review right shoulder MRI shows supraspinatus interstitial delamination tear, more posterior and measured at 1x1cm. No tearing to infraspinatus or subscapularis. Labrum intact. No biceps tendon tearing. No significant arthrosis.     Assessment:    Right shoulder rotator cuff tear     Plan:    We reviewed MRI results with the patient today. Patient has rotator cuff tear. Her tear is small, but patient has tried several attempts at conservative treatment and failed. She wants to discuss operative treatment. Recommend surgical treatment with right shoulder arthroscopy with rotator cuff repair with possible use of regeneten patch and subacromial decompression with Dr. Henreitta Cea.  I went over the risks, benefits, and alternatives to surgical treatment with the patient as well as the post-op recovery plan.  The patient is interested in surgical treatment and electively signed the consent.  We will get it set up at a mutually convenient time.  Everything was explained in detail to the patient who voiced understanding and agreement with the plan.     Caty Tessler, PA-C

## 2021-05-10 ENCOUNTER — Ambulatory Visit
Admit: 2021-05-10 | Payer: PRIVATE HEALTH INSURANCE | Attending: Student in an Organized Health Care Education/Training Program | Primary: Student in an Organized Health Care Education/Training Program

## 2021-05-10 ENCOUNTER — Ambulatory Visit
Admit: 2021-05-10 | Payer: PRIVATE HEALTH INSURANCE | Primary: Student in an Organized Health Care Education/Training Program

## 2021-05-10 DIAGNOSIS — I10 Essential (primary) hypertension: Secondary | ICD-10-CM

## 2021-05-10 DIAGNOSIS — Z1211 Encounter for screening for malignant neoplasm of colon: Secondary | ICD-10-CM

## 2021-05-10 LAB — HEMOGLOBIN A1C: Hemoglobin A1C: 5.3 % (ref 4.0–5.6)

## 2021-05-10 LAB — RENAL FUNCTION PANEL W/EGFR
Albumin: 3.9 g/dL (ref 3.5–5.7)
Anion Gap: 8 mmol/L (ref 3–16)
BUN: 13 mg/dL (ref 7–25)
CO2: 26 mmol/L (ref 21–33)
Calcium: 9.1 mg/dL (ref 8.6–10.3)
Chloride: 105 mmol/L (ref 98–110)
Creatinine: 0.89 mg/dL (ref 0.60–1.30)
EGFR: 79
Glucose: 88 mg/dL (ref 70–100)
Osmolality, Calculated: 288 mOsm/kg (ref 278–305)
Phosphorus: 4.1 mg/dL (ref 2.1–4.7)
Potassium: 4 mmol/L (ref 3.5–5.3)
Sodium: 139 mmol/L (ref 133–146)

## 2021-05-10 MED ORDER — olmesartan (BENICAR) 20 MG tablet
20 | ORAL_TABLET | Freq: Every day | ORAL | 0 refills | Status: AC
Start: 2021-05-10 — End: 2021-06-27

## 2021-05-10 MED ORDER — hydroCHLOROthiazide (HYDRODIURIL) 25 MG tablet
25 | ORAL_TABLET | Freq: Every day | ORAL | 3 refills | 90.00000 days | Status: AC
Start: 2021-05-10 — End: 2021-05-12

## 2021-05-10 NOTE — Unmapped (Signed)
Patient previously on HCTZ 25 and nifedipine 30 but had self discontinued the nifedipine secondary to headaches and has only been taking HCTZ.  BP Readings from Last 3 Encounters:   05/10/21 (!) 172/103   10/03/20 (!) 149/99   07/31/20 140/76     Patient will likely need the addition of 2 new agents for better BP control. For now I will start olmesartan 20 and follow up about BP over the next 2-4 weeks. Plan to ultimately start olmesartan-amlo-HCTZ combo pill. Will get renal panel today and in 2-4 weeks.

## 2021-05-10 NOTE — Unmapped (Signed)
Thank you for coming in today!    Plan for this visit:  - start olmesartan 20 daily, continue HCTZ 25 daily  - I will message you with lab results    Future Appointments   Date Time Provider Department Center   05/28/2021 11:00 AM [facility] 1 [facility]   07/31/2021 10:25 AM Arman Bogus, PA [facility] SPRT [facility] HOL       Please call the office with any questions you may have.      If you do not know your schedule that far in advance, PLEASE schedule a TENTATIVE appointment date and you can always call to change or cancel your appointment date/time by calling the clinic at [phone].    Give your feedback! Your feedback is the most helpful information we receive to make changes within our practice. You can give feedback by completing surveys you receive via email or traditional mail, as well as by calling our patient experience department. Thank you for your help!

## 2021-05-10 NOTE — Unmapped (Addendum)
Patient with progressive DOE that has been ongoing the last few months. She is able to walk around and go up stairs but gets SOB. Denies chest pain, orthopnea, PND, leg swelling. Denies wheezing, cough, lung disease. Does not smoke.    Right now my major suspicion is for deconditioning secondary to sedentary lifestyle and obesity. Recommended that she start an exercise regimen and work on weight loss. If this does not solve her issues plan would be TTE/PFTs vs exercise stress echo and PFTs.

## 2021-05-10 NOTE — Unmapped (Signed)
Alexandra [name] is 50 y.o. with a history of HTN, BMI 44 presents today for Follow-up (Wants to discuss if eligible for gastric bypass and want to change her BP meds) and Medication Refill (naproxen)    #DOE  Last visit diagnosed with rotator cuff tear, and plan for surgery repair in Sept. Her formal preop visit is scheduled in August. However, when prompted today, resident found that she can walk up stairs, but gets SOB. Denies orthopnea, PND,chest pain, asthma/COPD symptoms. DDx: cardiac vs pulmonary etiology vs deconditioning.  - start an exercise regimen   - if DOE is persistent at follow up preop visit, will consider further objective measurement including exercise TTE vs PFTs vs CPET?    #HTN  BP Readings from Last 3 Encounters:   05/10/21 (!) 172/103   10/03/20 (!) 149/99   07/31/20 140/76     The 10-year ASCVD risk score Denman George DC Jr., et al., 2013) is: 4.2%    Values used to calculate the score:      Age: 67 years      Sex: Female      Is Non-Hispanic African American: Yes      Diabetic: No      Tobacco smoker: No      Systolic Blood Pressure: 172 mmHg      Is BP treated: No      HDL Cholesterol: 62 mg/dL      Total Cholesterol: 156 mg/dL    On HCTZ 25. She stopped taking nifedipine due to headaches shortly after last visit. She has tolerated amlodipine before.  - will plan to start olmesartan 20 mg today for optimal adherence and BP control.    #BMI 44  Patient interested in weight loss surgery.  - refer to Bariatric surgery    #HCM  - obtain screening renal + A1C today  - order cologuard  - order Mammogram  - discuss COVID dose 2 today    Health Maintenance Due   Topic Date Due   ??? Colorectal Cancer Screening (MyChart)  Never done   ??? Immunization: COVID-19 (2 - Pfizer series) 03/10/2020   ??? Mammogram (MyChart)  Never done   ??? Immunization: Zoster (1 of 2) Never done   ??? Renal Function/GFR  02/16/2021   ??? Diabetes Screening  02/16/2021   ??? Cervical Cancer Screening/Pap Smear (MyChart)  06/14/2021     Return  to Clinic: HTN followup    Attending Attestation  I did not see or examine the patient. I discussed with the resident and agree with Vivianne Spence, MD's findings and plan as documented in the note.    Al Corpus, MD  05/10/2021 8:30 AM

## 2021-05-10 NOTE — Unmapped (Signed)
[facility]  INTERNAL MEDICINE RESIDENT PRACTICE  [address] [place] [address]    Name:  Alexandra [name]  Date of Birth: Aug 11, 1971 (50 y.o.)   MRN: [id number]    Date of Service:  05/10/2021     Subjective  Chief Complaint:     f/u    HPI/Assessment/Plan:     Alexandra [name] is a 50 y.o. female with history of HTN, morbid obesity, b/l shoulder pain. Today we discussed the following:      Diagnoses and all orders for this visit:    Hypertension, essential (Primary)  Assessment & Plan:  Patient previously on HCTZ 25 and nifedipine 30 but had self discontinued the nifedipine secondary to headaches and has only been taking HCTZ.  BP Readings from Last 3 Encounters:   05/10/21 (!) 172/103   10/03/20 (!) 149/99   07/31/20 140/76     Patient will likely need the addition of 2 new agents for better BP control. For now I will start olmesartan 20 and follow up about BP over the next 2-4 weeks. Plan to ultimately start olmesartan-amlo-HCTZ combo pill. Will get renal panel today and in 2-4 weeks.    Orders:  -     Renal Function Panel w/EGFR; Future  -     olmesartan (BENICAR) 20 MG tablet; Take 1 tablet (20 mg total) by mouth daily.  -     hydroCHLOROthiazide (HYDRODIURIL) 25 MG tablet; Take 1 tablet (25 mg total) by mouth daily.    Dyspnea on exertion  Assessment & Plan:  Patient with progressive DOE that has been ongoing the last few months. She is able to walk around and go up stairs but gets SOB. Denies chest pain, orthopnea, PND, leg swelling. Denies wheezing, cough, lung disease. Does not smoke.    Right now my major suspicion is for deconditioning secondary to sedentary lifestyle and obesity. Recommended that she start an exercise regimen and work on weight loss. If this does not solve her issues plan would be TTE/PFTs vs exercise stress echo and PFTs.      Body mass index (BMI) 40.0-44.9, adult (CMS Dx)  Assessment & Plan:  No prediabes so not a great candidate for GLP. Interested in bariatric surgery.  Placed referral.    Orders:  -     Hemoglobin A1c; Future  -     Bariatric Surgery / Weight Loss    Colon cancer screening  -     Cologuard Test (Stool); Future  -     Cologuard Test (Stool)      Leave results on voicemail/Mychart/neither? Mychart  Health Maintenance Due   Topic Date Due   ??? Colorectal Cancer Screening (MyChart)  Never done   ??? Immunization: COVID-19 (2 - Pfizer series) 03/10/2020   ??? Mammogram (MyChart)  Never done   ??? Immunization: Zoster (1 of 2) Never done   ??? Cervical Cancer Screening/Pap Smear (MyChart)  06/14/2021       Current Outpatient Medications:  Current Outpatient Medications   Medication Sig Dispense Refill   ??? hydroCHLOROthiazide (HYDRODIURIL) 25 MG tablet Take 1 tablet (25 mg total) by mouth daily. 90 tablet 3   ??? acetaminophen (TYLENOL EXTRA STRENGTH) 500 MG tablet Take 1 tablet (500 mg total) by mouth every 6 hours as needed. 90 tablet 2   ??? diclofenac (VOLTAREN) 50 MG EC tablet Take 1 tablet (50 mg total) by mouth 2 times a day. 60 tablet 0   ??? fluticasone propionate (  FLONASE) 50 mcg/actuation nasal spray Use 1 spray into each nostril daily. 16 g 0   ??? lidocaine (LMX) 4 % cream Apply topically 3 times a day as needed. 45 g 0   ??? naproxen (NAPROSYN) 500 MG tablet Take 1 tablet (500 mg total) by mouth 2 times a day with meals. 60 tablet 0   ??? olmesartan (BENICAR) 20 MG tablet Take 1 tablet (20 mg total) by mouth daily. 90 tablet 0     No current facility-administered medications for this visit.       ROS:     As noted in HPI    Objective:     Vitals:    05/10/21 0802 05/10/21 0819 05/10/21 0821   BP:  (!) 167/118 (!) 172/103   Weight: (!) 245 lb 4.8 oz (111.3 kg)          Body mass index is 44.87 kg/m??.    Physical Exam  Constitutional:       General: She is not in acute distress.  Eyes:      General: No scleral icterus.     Conjunctiva/sclera: Conjunctivae normal.   Cardiovascular:      Rate and Rhythm: Normal rate and regular rhythm.      Pulses: Normal pulses.   Pulmonary:       Effort: Pulmonary effort is normal. No accessory muscle usage.   Abdominal:      General: There is no distension.      Palpations: Abdomen is soft.      Tenderness: There is no abdominal tenderness.   Musculoskeletal:      Cervical back: Normal range of motion.      Right lower leg: No edema.      Left lower leg: No edema.   Skin:     General: Skin is warm and dry.      Coloration: Skin is not jaundiced.   Neurological:      General: No focal deficit present.      Mental Status: She is alert.   Psychiatric:         Behavior: Behavior normal.         Follow Up:     Return in about 1 month (around 06/10/2021) for In Office Visit.    Future Appointments   Date Time Provider Department Center   05/28/2021 11:00 AM [facility] 1 [facility]   06/17/2021 10:00 AM Vivianne Spence, MD [facility] RES HOX HOX   07/31/2021 10:25 AM Arman Bogus, PA [facility] SPR[facility]       Pre-visit planning performed.  Discussed self management and treatment goals with patient.   Barriers to meeting these goals were discussed.  Information regarding the usage and side effects of any new medication were provided.       Vivianne Spence, MD

## 2021-05-10 NOTE — Unmapped (Signed)
No prediabes so not a great candidate for GLP. Interested in bariatric surgery. Placed referral.

## 2021-05-13 MED ORDER — hydroCHLOROthiazide (HYDRODIURIL) 25 MG tablet
25 | ORAL_TABLET | Freq: Every day | ORAL | 3 refills | 90.00000 days | Status: AC
Start: 2021-05-13 — End: 2022-02-17

## 2021-05-13 NOTE — Unmapped (Signed)
Medication contraindicated for her severe HTN

## 2021-05-13 NOTE — Unmapped (Signed)
Future Appointments   Date Time Provider Department Center   05/28/2021 11:00 AM [facility] S[facility] MAM 1 [facility]   06/17/2021 10:00 AM Vivianne Spence, MD [facility] RES HOX HOX   07/31/2021 10:25 AM Arman Bogus, PA [facility] SPRT [place] HOL       LTC 05/10/21

## 2021-05-14 MED ORDER — methocarbamoL (ROBAXIN) 500 MG tablet
500 | ORAL_TABLET | Freq: Three times a day (TID) | ORAL | 0 refills | Status: AC | PRN
Start: 2021-05-14 — End: 2021-07-16

## 2021-05-14 NOTE — Unmapped (Signed)
Left message with my contact information to call to discuss weight loss options.  Added patients information to contact tracker and will continue to reach out to patient.

## 2021-05-14 NOTE — Unmapped (Signed)
Addended by: Vivianne Spence on: 05/14/2021 03:59 PM     Modules accepted: Orders

## 2021-05-20 NOTE — Unmapped (Signed)
Pt. Called and needs to reschedule her surgery date from 07/18/21  To  9/22 , I did explain she will need to reschedule her pre op exam scheduled for 06/17/21 , she expressed understanding .    aks

## 2021-05-21 NOTE — Unmapped (Signed)
Interested in [facility]- Investment banker, corporate and wait

## 2021-05-24 NOTE — Unmapped (Signed)
Messaged Ms. Damiano regarding obtaining follow up labs for olmesartan initiation and home BP readings. Plan to increase BP regimen pending home readings.    Vivianne Spence, MD  Internal Medicine, PGY-3  3:06 PM, 05/24/2021

## 2021-05-28 ENCOUNTER — Ambulatory Visit: Payer: PRIVATE HEALTH INSURANCE | Primary: Student in an Organized Health Care Education/Training Program

## 2021-06-04 MED ORDER — lidocaine (PF) 2% (20 mg/mL) Soln 20 mg
20 | Freq: Once | INTRAMUSCULAR | Status: AC | PRN
Start: 2021-06-04 — End: 2021-06-04

## 2021-06-07 MED ORDER — lidocaine (PF) 2% (20 mg/mL) Soln 20 mg
20 | Freq: Once | INTRAMUSCULAR | Status: AC | PRN
Start: 2021-06-07 — End: 2021-06-07

## 2021-06-17 ENCOUNTER — Ambulatory Visit
Payer: PRIVATE HEALTH INSURANCE | Attending: Student in an Organized Health Care Education/Training Program | Primary: Student in an Organized Health Care Education/Training Program

## 2021-06-27 ENCOUNTER — Ambulatory Visit
Admit: 2021-06-27 | Payer: PRIVATE HEALTH INSURANCE | Primary: Student in an Organized Health Care Education/Training Program

## 2021-06-27 ENCOUNTER — Ambulatory Visit
Admit: 2021-06-27 | Discharge: 2021-06-27 | Payer: PRIVATE HEALTH INSURANCE | Attending: Student in an Organized Health Care Education/Training Program | Primary: Student in an Organized Health Care Education/Training Program

## 2021-06-27 DIAGNOSIS — I10 Essential (primary) hypertension: Secondary | ICD-10-CM

## 2021-06-27 LAB — RENAL FUNCTION PANEL W/EGFR
Albumin: 4 g/dL (ref 3.5–5.7)
Anion Gap: 11 mmol/L (ref 3–16)
BUN: 13 mg/dL (ref 7–25)
CO2: 26 mmol/L (ref 21–33)
Calcium: 10.1 mg/dL (ref 8.6–10.3)
Chloride: 103 mmol/L (ref 98–110)
Creatinine: 1.05 mg/dL (ref 0.60–1.30)
EGFR: 64
Glucose: 92 mg/dL (ref 70–100)
Osmolality, Calculated: 290 mOsm/kg (ref 278–305)
Phosphorus: 3.9 mg/dL (ref 2.1–4.7)
Potassium: 3.9 mmol/L (ref 3.5–5.3)
Sodium: 140 mmol/L (ref 133–146)

## 2021-06-27 MED ORDER — amlodipine-olmesartan (AZOR) 5-20 mg per tablet
5-20 | ORAL_TABLET | Freq: Every day | ORAL | 1 refills | Status: AC
Start: 2021-06-27 — End: 2021-09-09

## 2021-06-27 MED ORDER — lidocaine (LIDODERM) 5 %
5 | MEDICATED_PATCH | TOPICAL | 0 refills | 30.00000 days | Status: AC
Start: 2021-06-27 — End: 2022-01-20

## 2021-06-27 NOTE — Unmapped (Signed)
[facility]  INTERNAL MEDICINE RESIDENT PRACTICE  [address] [place] [address]    Name:  Alexandra [name]  Date of Birth: 1971-10-08 (50 y.o.)   MRN: [id number]    Date of Service:  06/27/2021     Subjective  Chief Complaint:     Pre op    HPI/Assessment/Plan:     Alexandra [name] is a 50 y.o. female with history of HTN, morbid obesity, b/l shoulder pain. Today we discussed the following:      Diagnoses and all orders for this visit:    Preop examination (Primary)  Assessment & Plan:  Getting right rotator cuff surgery under general anesthesia.    RCRI 0  Low risk surgery  No decompensations  Has tolerated general anesthesia before  No family members with hx of MI or CVA  No respiratory sxs    Patient is not in need of further cardiac testing or evaluation prior to surgery. She is at low risk for cardiac complications.      Primary hypertension  Assessment & Plan:  Patient with BP slightly over goal in office today but home BP are nearly normal except for diastolic. Will start olmesartan 20/amlo 5 combo pill and continue HCTZ 25.    Orders:  -     amlodipine-olmesartan (AZOR) 5-20 mg per tablet; Take 1 tablet by mouth daily.    Dyspnea on exertion  Assessment & Plan:  Patient still with some mild DOE but it is not limiting her. She has not started an exercise regimen yet like we discussed last visit. After discussing cardiac symptoms and exertional sxs, I am even less convinced now of a cardiac cause, instead I think this is deconditioning. Exam was benign. If she starts an exercise regimen and still has issues following that, a cardiopulmonary workup would be warranted.      Other orders  -     lidocaine (LIDODERM) 5 %; Place 1 patch onto the skin daily. Apply patch for 12 hours and then remove patch and leave off for 12 hours.      Leave results on voicemail/Mychart/neither? Mychart  Health Maintenance Due   Topic Date Due   ??? Colorectal Cancer Screening (MyChart)  Never done   ??? Immunization:  COVID-19 (2 - Pfizer series) 03/10/2020   ??? Mammogram (MyChart)  Never done   ??? Immunization: Zoster (1 of 2) Never done   ??? Cervical Cancer Screening/Pap Smear (MyChart)  06/14/2021       Current Outpatient Medications:  Current Outpatient Medications   Medication Sig Dispense Refill   ??? acetaminophen (TYLENOL EXTRA STRENGTH) 500 MG tablet Take 1 tablet (500 mg total) by mouth every 6 hours as needed. 90 tablet 2   ??? diclofenac (VOLTAREN) 50 MG EC tablet Take 1 tablet (50 mg total) by mouth 2 times a day. 60 tablet 0   ??? hydroCHLOROthiazide (HYDRODIURIL) 25 MG tablet Take 1 tablet (25 mg total) by mouth daily. 90 tablet 3   ??? methocarbamoL (ROBAXIN) 500 MG tablet Take 1 tablet (500 mg total) by mouth 3 times a day as needed. 20 tablet 0   ??? amlodipine-olmesartan (AZOR) 5-20 mg per tablet Take 1 tablet by mouth daily. 90 tablet 1   ??? lidocaine (LIDODERM) 5 % Place 1 patch onto the skin daily. Apply patch for 12 hours and then remove patch and leave off for 12 hours. 30 patch 0     No current facility-administered medications for this visit.  ROS:     As noted in HPI    Objective:     Vitals:    06/27/21 1355 06/27/21 1408   BP: (!) 143/102 (!) 136/93   Pulse: 74    Height: 5' 2 (1.575 m)    Weight: (!) 236 lb (107 kg)    BMI (Calculated): 43.15         Body mass index is 43.16 kg/m??.    Physical Exam  Constitutional:       General: She is not in acute distress.     Appearance: She is obese.   Eyes:      General: No scleral icterus.     Conjunctiva/sclera: Conjunctivae normal.   Neck:      Vascular: No JVD.   Cardiovascular:      Rate and Rhythm: Normal rate and regular rhythm.      Pulses: Normal pulses.   Pulmonary:      Effort: Pulmonary effort is normal. No accessory muscle usage.   Abdominal:      General: There is no distension.      Palpations: Abdomen is soft.      Tenderness: There is no abdominal tenderness.   Musculoskeletal:      Cervical back: Normal range of motion.      Right lower leg: No edema.       Left lower leg: No edema.   Skin:     General: Skin is warm and dry.      Coloration: Skin is not jaundiced.   Neurological:      General: No focal deficit present.      Mental Status: She is alert.   Psychiatric:         Behavior: Behavior normal.         Follow Up:     Return in about 1 month (around 07/28/2021) for In Office Visit.    Future Appointments   Date Time Provider Department Center   08/07/2021 11:10 AM Arman Bogus, PA [facility]   08/07/2021  1:20 PM Vivianne Spence, MD [facility]       Pre-visit planning performed.  Discussed self management and treatment goals with patient.   Barriers to meeting these goals were discussed.  Information regarding the usage and side effects of any new medication were provided.       Vivianne Spence, MD

## 2021-06-27 NOTE — Unmapped (Signed)
I did not see or examine the patient. I discussed with the resident and agree with Vivianne Spence, MD's findings and plan as documented in the resident's note.    Larina Bras, MD   Here for preop eval. RCRI is 0.  HTN is above goal this visit.  Add amlodipine.

## 2021-06-27 NOTE — Unmapped (Addendum)
Patient with BP slightly over goal in office today but home BP are nearly normal except for diastolic. Will start olmesartan 20/amlo 5 combo pill and continue HCTZ 25.

## 2021-06-27 NOTE — Unmapped (Signed)
Thank you for coming in today!    Plan for this visit:  - No further testing needed prior to surgery. Hold the olmesartan/amlodipine (     Future Appointments   Date Time Provider Department Center   08/07/2021 11:10 AM Arman Bogus, PA [facility]       Please call the office with any questions you may have.      If you do not know your schedule that far in advance, PLEASE schedule a TENTATIVE appointment date and you can always call to change or cancel your appointment date/time by calling the clinic at [phone].    Give your feedback! Your feedback is the most helpful information we receive to make changes within our practice. You can give feedback by completing surveys you receive via email or traditional mail, as well as by calling our patient experience department. Thank you for your help!

## 2021-06-27 NOTE — Unmapped (Signed)
Patient still with some mild DOE but it is not limiting her. She has not started an exercise regimen yet like we discussed last visit. After discussing cardiac symptoms and exertional sxs, I am even less convinced now of a cardiac cause, instead I think this is deconditioning. Exam was benign. If she starts an exercise regimen and still has issues following that, a cardiopulmonary workup would be warranted.

## 2021-06-27 NOTE — Unmapped (Addendum)
Getting right rotator cuff surgery under general anesthesia.    RCRI 0  Low risk surgery  No decompensations  Has tolerated general anesthesia before  No family members with hx of MI or CVA  No respiratory sxs    Patient is not in need of further cardiac testing or evaluation prior to surgery. She is at low risk for cardiac complications.

## 2021-07-04 NOTE — Unmapped (Signed)
Offered teams

## 2021-07-10 NOTE — Unmapped (Signed)
Checking status

## 2021-07-15 NOTE — Unmapped (Signed)
We have attempted to reach out to this patient multiple times through either voice mail and/or email without any response.  We just sent our final attempt to contact.

## 2021-07-16 MED ORDER — methocarbamoL (ROBAXIN) 500 MG tablet
500 | ORAL_TABLET | Freq: Three times a day (TID) | ORAL | 0 refills | Status: AC | PRN
Start: 2021-07-16 — End: 2022-01-20

## 2021-07-18 NOTE — Unmapped (Signed)
06/07/21 0002   Pre-op Phone Call   Surgery Time Verified   (date only 9/22)   Arrival Time Verified   (Per MD)   Surgery Location Verified Yes  [facility])   Remind patient to bring picture ID and insurance card Yes   Medical History Reviewed Yes   NPO Status Reinforced Yes   Ride and Caregiver Arranged Yes   Ride Caregiver Provider Fayrene Fearing, sig other   Phone Number for Ride/Caregiver [phone]   Reviewed Pre-Op instructions with patient. No hx of problems with anesthesia. Reviewed fasting guideline: NPO 8hrs prior to arrival or follow Mychart.  .Avoid vitamins, supplements, NSAIDS including ASA, Advil, Ibuprofen, Aleve, or Naproxen from now until procedure. Ok to take Acetaminophen. Reviewed med list. Instructed to HOLD  HCTZ and Azcor the morning of surgery. Instructed to take methobarbamol with small sips of water the morning of surgery. Patient voiced understanding of all instructions.

## 2021-07-24 NOTE — Unmapped (Addendum)
Shoulder Arthroscopy    Because the shoulder is one of the most mobile joints, it is more prone to injury. It is a very shallow ball and socket joint located between the large bone in your upper arm (humerus) and the shoulder blade (scapula). Arthroscopy is a valuable test for evaluating and treating injuries involving the shoulder joint. Arthroscopy is a surgical technique which uses small incisions (cuts by the surgeon) to insert a small telescope like instrument (arthroscope) and other tools into the shoulder. This allows the surgeon to look directly at the problem. When the arthroscope is in the joint, fluid is used to expand the joint space. This allows the surgeon to examine it more easily. The arthroscope then beams light into the joint and sends an image to a TV screen. As your surgeon examines your shoulder, he or she can also repair a number of problems found at the same time. Sometimes the procedure may change to an open surgery. This would happen if the problems are severe enough that they cannot be corrected with just arthroscopy. This is usually a very safe surgery. Rare complications include damage to nerves or blood vessels, excess bleeding, blood clots, infection, and rarely instrument failure. This is most often performed as a same day surgery. This means you will not have to stay in the hospital overnight. Recovery from this surgery is also much faster than having an open procedure.      AFTER YOUR PROCEDURE  Activity:  It will be normal to be sore for a couple days after surgery. See your caregiver if this seems to be getting worse rather than better.   Minimize activity for the first 24-72 hours.  You may be sent home with a polar care cooler. This may help with discomfort and keep the swelling down.   You may use it as frequently and for as long as you want.  If you know that you are sensitive to the cold, you may put it on 20 minutes every hour.  There must be a towel or shirt between your  skin and the ice implement at all times.  Follow instructions for the pain and/or anti-inflammatory medications prescribed by your physician.   You may not shower until three days after surgery. After 3 days, you may remove the dressing and shower.  Pat dry afterwards and apply band-aids to the incisions.  No swimming, baths, or whirlpools (where water could soak into the wound) for 2 weeks after surgery. We cleaned your operative extremity with an orange/yellow tinted solution, so the skin above, on, and below the surgical site may look a tinted orange/yellow color.  Keep your first post-operative office appointment.  Your sutures will be removed at 10-14 days.   You may resume normal diet as tolerated.   Stay in your sling or brace as prescribed.  If you have a sling, you may slip out of it -keeping the shoulder at your side- to bend your elbow and hand.  This will help with stiffness.  If you received a nerve block prior to or after your procedure, it will take time to wear off.  You may feel tingling or changes in sensation as it is wearing off.    Sleeping in the semi-reclined position may increase your comfort and ability to sleep at night.  This is not mandatory.  Keep Sling intact except it is ok to gently remove sling and while holding the shoulder still you can do elbow and hand ROM exercises    REHABILITATION  Almost as important as your surgery is your rehabilitation. A physical therapy prescription will be given.  Please make an appointment to start PT as soon as possible after surgery.    SEEK IMMEDIATE MEDICAL CARE IF:  There is redness, swelling, or unbearably increased pain in the wound or joint.   You notice purulent (colored- pus-like) drainage coming from the wound.   An unexplained oral temperature above 101.5 develops.   You notice a foul smell coming from the wound or dressing.   There is a breaking open of the wound. The edges do not stay together after sutures or tape has been removed.    Persistent bleeding from the small incision that soaks the bandage.  Chest pain or shortness of breath.  Severe nausea or vomiting.      If you have an emergency or need assistance immediately you should call 911/go to the emergency room nearest you or call the office at  [phone].    Shoulder Handout Instruction Sheet      Sling / Immobilizer:  The most important part of your early post-operative care is the protection of your surgical repair.  Please wear the shoulder sling / immobilizer at all times to include sleeping, with exception of the brief time you are performing physical therapy exercises (listed below).     Ice Pack: Use as needed to help with pain relief.     Pain meds: take pain medication as prescribed.    Can NOT drive while taking pain meds (D.U.I.)  Can NOT drive while in sling     Driving: Make transportation arrangements in advance so you can attend your physical therapy and orthopedic appointments.      Make sure you schedule and that you attend a physical therapy post-op appt. within 3-5 days of your surgery.    The following exercises should be performed (10-20 repetitions, 3-4 times per day).      Modified Pendulums      Lean over and let arm hang limp in sling.  Unlike this picture, support the affected arm by placing your hand under the elbow.  Using your non-surgical arm, gently and slowly move your operated arm forward and backward, side to side, and in clockwise and counter clockwise circles.  Perform 10-20 repetitions in each direction.  Ok to perform out of sling if comfortable.   Elbow/Wrist/Hand Range-of-Motion                Keeping your thumb pointed to ceiling, simply bend and straighten your elbow.  Then rotate your elbow   by pointing your thumb to the right and to the left.  Also, bend and straighten the wrist and fingers.  Include hand gripping exercises using the theraputty.

## 2021-07-25 MED ORDER — rocuronium (ZEMURON) injection
10 | INTRAVENOUS | Status: AC | PRN
Start: 2021-07-25 — End: 2021-07-25
  Administered 2021-07-25: 17:00:00 50 via INTRAVENOUS

## 2021-07-25 MED ORDER — oxyCODONE (ROXICODONE) immediate release tablet 2.5 mg
5 | ORAL | Status: AC | PRN
Start: 2021-07-25 — End: 2021-07-25

## 2021-07-25 MED ORDER — ondansetron (ZOFRAN) injection
4 | INTRAMUSCULAR | Status: AC | PRN
Start: 2021-07-25 — End: 2021-07-25
  Administered 2021-07-25: 18:00:00 4 via INTRAVENOUS

## 2021-07-25 MED ORDER — HYDROmorphone (DILAUDID) injection Syrg 0.5 mg
0.5 | INTRAMUSCULAR | Status: AC | PRN
Start: 2021-07-25 — End: 2021-07-25

## 2021-07-25 MED ORDER — fentaNYL (SUBLIMAZE) injection 12.5 mcg
50 | INTRAMUSCULAR | Status: AC | PRN
Start: 2021-07-25 — End: 2021-07-25

## 2021-07-25 MED ORDER — propofol 10 mg/ml (DIPRIVAN) injection
10 | INTRAVENOUS | Status: AC | PRN
Start: 2021-07-25 — End: 2021-07-25
  Administered 2021-07-25: 17:00:00 200 via INTRAVENOUS

## 2021-07-25 MED ORDER — ondansetron (ZOFRAN) injection 4 mg
4 | Freq: Three times a day (TID) | INTRAMUSCULAR | Status: AC | PRN
Start: 2021-07-25 — End: 2021-07-25

## 2021-07-25 MED ORDER — ondansetron (ZOFRAN) 4 MG tablet
4 | ORAL_TABLET | Freq: Three times a day (TID) | ORAL | 0 refills | Status: AC | PRN
Start: 2021-07-25 — End: 2021-12-11

## 2021-07-25 MED ORDER — HYDROmorphone (DILAUDID) injection Syrg 0.2 mg
0.5 | INTRAMUSCULAR | Status: AC | PRN
Start: 2021-07-25 — End: 2021-07-25

## 2021-07-25 MED ORDER — ibuprofen (MOTRIN) 600 MG tablet
600 | ORAL_TABLET | Freq: Three times a day (TID) | ORAL | 1 refills | Status: AC
Start: 2021-07-25 — End: 2021-12-04

## 2021-07-25 MED ORDER — proMETHazine (PHENERGAN) injection 6.25 mg
25 | Freq: Four times a day (QID) | INTRAMUSCULAR | Status: AC | PRN
Start: 2021-07-25 — End: 2021-07-25

## 2021-07-25 MED ORDER — glucose chewable tablet 12 g
4 | ORAL | Status: AC | PRN
Start: 2021-07-25 — End: 2021-07-25

## 2021-07-25 MED ORDER — celecoxib (CELEBREX) capsule 200 mg
200 | ORAL | Status: AC | PRN
Start: 2021-07-25 — End: 2021-07-25
  Administered 2021-07-25: 16:00:00 200 mg via ORAL

## 2021-07-25 MED ORDER — midazolam (VERSED) injection
1 | INTRAMUSCULAR | Status: AC
Start: 2021-07-25 — End: 2021-07-25
  Administered 2021-07-25: 17:00:00 2 via INTRAVENOUS

## 2021-07-25 MED ORDER — glycopyrrolate (ROBINUL) injection
0.2 | INTRAMUSCULAR | Status: AC | PRN
Start: 2021-07-25 — End: 2021-07-25
  Administered 2021-07-25: 19:00:00 .8 via INTRAVENOUS

## 2021-07-25 MED ORDER — dextrose 10%-water (D10W) IV soln
INTRAVENOUS | Status: AC | PRN
Start: 2021-07-25 — End: 2021-07-25

## 2021-07-25 MED ORDER — naloxone (NARCAN) injection 0.04 mg
0.4 | INTRAMUSCULAR | Status: AC | PRN
Start: 2021-07-25 — End: 2021-07-25

## 2021-07-25 MED ORDER — lactated Ringers infusion
INTRAVENOUS | Status: AC | PRN
Start: 2021-07-25 — End: 2021-07-25
  Administered 2021-07-25: 17:00:00 via INTRAVENOUS

## 2021-07-25 MED ORDER — cefazolin (ANCEF) 2 g in dextrose (iso-osmotic) 100 mL IVPB
2 | INTRAVENOUS | Status: AC | PRN
Start: 2021-07-25 — End: 2021-07-25
  Administered 2021-07-25: 17:00:00 2 g via INTRAVENOUS

## 2021-07-25 MED ORDER — dexamethasone (DECADRON) injection
4 | INTRAMUSCULAR | Status: AC | PRN
Start: 2021-07-25 — End: 2021-07-25
  Administered 2021-07-25: 18:00:00 8 via INTRAVENOUS

## 2021-07-25 MED ORDER — neostigmine methylsulfate (PROSTIGMIN) IV solution
1 | INTRAVENOUS | Status: AC | PRN
Start: 2021-07-25 — End: 2021-07-25
  Administered 2021-07-25: 19:00:00 5 via INTRAVENOUS

## 2021-07-25 MED ORDER — senna-docusate (SENNOSIDES-DOCUSATE SODIUM) 8.6-50 mg per tablet
8.6-50 | ORAL_TABLET | Freq: Two times a day (BID) | ORAL | 0 refills | Status: AC | PRN
Start: 2021-07-25 — End: 2022-01-20

## 2021-07-25 MED ORDER — oxyCODONE (ROXICODONE) immediate release tablet 5 mg
5 | ORAL | Status: AC | PRN
Start: 2021-07-25 — End: 2021-07-25

## 2021-07-25 MED ORDER — ropivacaine (PF) (NAROPIN) injection solution 0.5%
5 | Freq: Once | INTRAMUSCULAR | Status: AC
Start: 2021-07-25 — End: 2021-07-25

## 2021-07-25 MED ORDER — fentaNYL (SUBLIMAZE) injection 25 mcg
50 | INTRAMUSCULAR | Status: AC | PRN
Start: 2021-07-25 — End: 2021-07-25

## 2021-07-25 MED ORDER — acetaminophen (TYLENOL EXTRA STRENGTH) 500 MG tablet
500 | ORAL_TABLET | Freq: Three times a day (TID) | ORAL | 1 refills | 8.00000 days | Status: AC | PRN
Start: 2021-07-25 — End: 2022-03-17

## 2021-07-25 MED ORDER — oxyCODONE (ROXICODONE) 5 MG immediate release tablet
5 | ORAL_TABLET | Freq: Four times a day (QID) | ORAL | 0 refills | 6.00000 days | Status: AC | PRN
Start: 2021-07-25 — End: 2021-08-02

## 2021-07-25 MED ORDER — ropivacaine (PF) (NAROPIN) injection solution 0.5%
5 | INTRAMUSCULAR | Status: AC
Start: 2021-07-25 — End: 2021-07-25
  Administered 2021-07-25: 17:00:00 30

## 2021-07-25 MED ORDER — lactated Ringers infusion
INTRAVENOUS | Status: AC
Start: 2021-07-25 — End: 2021-07-25

## 2021-07-25 MED ORDER — lactated Ringers irrigation solution
Status: AC | PRN
Start: 2021-07-25 — End: 2021-07-25
  Administered 2021-07-25: 18:00:00 6000
  Administered 2021-07-25: 18:00:00 3000
  Administered 2021-07-25: 18:00:00 6000
  Administered 2021-07-25: 18:00:00 3000

## 2021-07-25 MED ORDER — fentaNYL (SUBLIMAZE) injection
50 | INTRAMUSCULAR | Status: AC | PRN
Start: 2021-07-25 — End: 2021-07-25
  Administered 2021-07-25: 17:00:00 25 via INTRAVENOUS
  Administered 2021-07-25: 17:00:00 50 via INTRAVENOUS
  Administered 2021-07-25: 17:00:00 25 via INTRAVENOUS

## 2021-07-25 MED ORDER — midazolam (PF) (VERSED) 1 mg/mL injection
1 | INTRAMUSCULAR | Status: AC
Start: 2021-07-25 — End: 2021-07-25

## 2021-07-25 MED ORDER — gabapentin (NEURONTIN) capsule 600 mg
300 | ORAL | Status: AC | PRN
Start: 2021-07-25 — End: 2021-07-25
  Administered 2021-07-25: 16:00:00 600 mg via ORAL

## 2021-07-25 MED ORDER — acetaminophen (TYLENOL) tablet 975 mg
325 | ORAL | Status: AC | PRN
Start: 2021-07-25 — End: 2021-07-25
  Administered 2021-07-25: 16:00:00 975 mg via ORAL

## 2021-07-25 MED ORDER — ropivacaine (PF) 0.5% (NAROPIN) 5 mg/mL (0.5 %) injection solution
5 | INTRAMUSCULAR | Status: AC
Start: 2021-07-25 — End: 2021-07-25

## 2021-07-25 MED ORDER — fentaNYL (SUBLIMAZE) 50 mcg/mL injection
50 | INTRAMUSCULAR | Status: AC
Start: 2021-07-25 — End: ?

## 2021-07-25 MED ORDER — midazolam (PF) (VERSED) injection 2 mg
1 | Freq: Once | INTRAMUSCULAR | Status: AC
Start: 2021-07-25 — End: 2021-07-25

## 2021-07-25 MED ORDER — lidocainePF220mgmLSoln
20 | INTRAMUSCULAR | Status: AC | PRN
Start: 2021-07-25 — End: 2021-07-25
  Administered 2021-07-25: 17:00:00 100 via INTRAVENOUS

## 2021-07-25 MED FILL — DEXTROSE 10%-WATER 250 ML FOR HYPOGLYCEMIA: 12.50 12.50 g | INTRAVENOUS | Qty: 250

## 2021-07-25 MED FILL — CELEBREX 200 MG CAPSULE: 200 200 mg | ORAL | Qty: 1

## 2021-07-25 MED FILL — FENTANYL (PF) 50 MCG/ML INJECTION SOLUTION: 50 50 mcg/mL | INTRAMUSCULAR | Qty: 2

## 2021-07-25 MED FILL — NAROPIN (PF) 5 MG/ML (0.5 %) INJECTION SOLUTION: 5 5 mg/mL (0.5 %) | INTRAMUSCULAR | Qty: 30

## 2021-07-25 MED FILL — DEXTROSE 10%-WATER 250 ML FOR HYPOGLYCEMIA: 25.00 25.00 g | INTRAVENOUS | Qty: 250

## 2021-07-25 MED FILL — GABAPENTIN 300 MG CAPSULE: 300 300 MG | ORAL | Qty: 2

## 2021-07-25 MED FILL — CEFAZOLIN 2 GRAM/100 ML IN DEXTROSE(ISO-OSMOTIC) INTRAVENOUS PIGGYBACK: 2 2 gram/100 mL | INTRAVENOUS | Qty: 100

## 2021-07-25 MED FILL — MIDAZOLAM (PF) 1 MG/ML INJECTION SOLUTION: 1 1 mg/mL | INTRAMUSCULAR | Qty: 2

## 2021-07-25 MED FILL — TYLENOL 325 MG TABLET: 325 325 mg | ORAL | Qty: 3

## 2021-07-25 NOTE — Unmapped (Signed)
INTRA-OP POST BRIEFING NOTE: Alexandra [name]      Specimens:     Prior to leaving the room: Nurse confirmed name of procedure, completion of instrument, sponge & needle counts, reads specimen labels aloud including patient name and addresses any equipment issues? Nurse confirmed wound class. Nurse to surgeon and anesthesia: What are key concerns for recovery and management of the patient?  Yes      Blood products stored at appropriate temperatures prior to return to blood bank (if applicable)? N/A      Patient identification band secured on patient prior to transfer out of the operating room? Yes    Temporary devices implanted for the duration of the surgery removed and evaluated for intactness and completeness prior to closure? Yes      Other Comments:     Signed: Guillermina City    Date: 07/25/2021    Time: 2:49 PM

## 2021-07-25 NOTE — Unmapped (Signed)
Anesthesia Extubation Criteria:    Airway Device: endotracheal tube    Emergence Details:      Smooth      _x_      Stormy       __       Prolonged   __     Extubation Criteria:      Motor strength intact       _x_      Follows commands        _x_      Good airway reflexes      _x_      OP suctioned                  _x_        Follows commands:  Yes     Patient extubated:  Yes

## 2021-07-25 NOTE — Unmapped (Addendum)
Shoulder Arthroscopy with Rotator Cuff Repair Procedure Note      Alexandra [name] (09-Oct-1971)  07/25/2021      Preoperative Diagnosis:  1.  Rotator cuff tear right shoulder                                          2. Outlet impingement right shoulder    Postoperative Diagnosis:  1.  Rotator cuff tear right shoulder                                          2. Outlet impingement right shoulder     Procedure:  1.   Diagnostic arthroscopy right shoulder           2.   Arthroscopic Rotator Cuff Repair right shoulder    3.   Subacromial decompression right shoulder                            Surgeon: Epimenio Foot, M.D.    Assistant: Vassie Loll, PA (a qualified resident was not available to assist)    Anesthesia: General with block    Estimated Blood Loss:  Minimal           Drains: None.           Specimens: None           Implants: Depuy Mitek tape loaded Healix anchor x 1     Mitek Healix knotless anchor x 2           Complications:  None           Condition: stable    Indications for Operation  The patient is a 50 y.o. female who had pain and MRI evidence of a rotator cuff tear.  Non-operative measures were unsuccessful at controlling the patient's pain and dysfunction.  Thus, the patient chose to proceed with the aforementioned procedures.   The risks, benefits, alternatives, complications, treatment options, and expected outcomes were discussed with the patient. At no time were any guarantees implied or stated. The patient concurred with the proposed plan, giving informed consent.    Site Marking and Surgical Prep  The patient was seen in the holding area and the appropriate extremity marked with a pen.  Interscalene block was administered by the anesthesia department. The patient was taken to the operative suite, identified, placed on the operating room table in the supine position.  After induction of general anesthesia, the patient was placed in the beach chair position with all bony prominences adequately  padded and the head and neck anatomically supported.  The patient received preoperative IV antibiotics.    Examination  Under Anesthesia  Full symmetric range of motion, no instability    Diagnostic Arthroscopy  The upper extremity from the neck to fingertips was prepped with Chloraprep and draped in the standard fashion.  The forearm was well padded and secured in the [employer] Spider extremity positioning device.  A time out was performed verifying the correct patient, procedure, and operative extremity.  A standard midglenoid posterior portal was established.  The trocar and cannula were inserted.  The trocar was removed and the arthroscope and inflow inserted.  An anteriorinferior portal was established in the rotator interval and a  disposable cannula inserted.  Systematic arthroscopy was performed and the findings are summarized below.    1. Superior Labrum/Biceps Tendon- The biceps tendon and superior labrum were intact.    2. Anterior/Posterior Labrum- The anterior and posterior labrum were intact without fraying.  3. Capsule: normal  4. Rotator Cuff- near full thickness articular tear of anterior supraspinatus  5. Inferior Axillary Recess- No loose bodies  6. Articular Surfaces- Intact with Grade 1 changes to the humeral head  7. Difffuse synovitis - absent      Intra-articular Debridement  A shaver was used to debride the articular portion of the rotator cuff tear.  It was marked using a Prolene suture to aid in finding it on the bursal side.    Subacromial Decompression  The trocar and cannula were redirected to the subacromial space.  The arthroscope and inflow were inserted.  A lateral portal was established after localizing the subacromial space with a spinal needle.  Using a both a shaver and a radiofrequency probe, the bursa and the tissue beneath the acromion were removed.  The coracromial ligament was divided.  A 5.5 mm bur was placed through the lateral portal and the anterior aspect of the acromion  was removed.  The arthroscope was then switched to the lateral portal and with the bur posteriorly, a smooth flat acromion was created.  The arm was abducted to 90 degrees and internally and externally rotated.  There was no evidence of outlet impingement.    Rotator Cuff Repair  Hypertrophic bursa was debrided.  The supraspinatus tendon tear was exposed and the configuration identified.  The tendon was mobilized to achieve a tension free repair.  The greater tuberosity was abraded to a bleeding bony surface. One medial row anchor was placed.  Sutures and tape were retrieved in a horizontal fashion using the ExpresSew.  Suture limbs were isolated and loaded into two Healix Knotless anchors placed laterally to create a transosseus equivalent repair.  The repair was under minimal tension.  The arm was abducted, internally and externally rotated.  Threre was no evidence of impingement.      Closure  The shoulder was drained.  Arthroscopic equipment was removed and the portals closed with 3-0 Nylon.  Sterile dressings were applied.  A polar care ice machine and sling were applied. The patient was awakened and taken to the postoperative area in stable condition.    A qualified resident was not available to assist on this case.  A PA was necessary to assist with positioning, prepping and draping, camera manipulation, anchor preparation, and closure during the case.

## 2021-07-25 NOTE — Unmapped (Signed)
Regional Block    Block Type -Upper Extremity: brachial plexus, interscalene         Laterality:  right    Medications Administered: Pre Sedation / Block Medications  midazolam (VERSED) injection 1 mg/mL - Intravenous   2 mg - 07/25/2021 12:48:00 PM  ropivacaine (PF) (NAROPIN) injection solution 0.5% - Regional   30 mL - 07/25/2021 12:54:00 PM  Medication administered at:  07/25/2021 12:47 PM         Preanesthetic Checklist  Completed:  patient identified, IV checked, risks and benefits discussed, monitors and equipment checked, pre-op evaluation, timeout performed, anesthesia consent, hand hygiene performed, patient being monitored, patient agrees, verbalizes understanding, and wants to proceed and questions answered / anesthesia plan accepted      Block Reason: primary anesthetic     Diagnosis:  postoperative pain management      Block Timing:  preoperatively  Timeout verification:  correct patient, correct procedure, correct site, allergies reviewed and anticoagulant precautions reviewed    Timeout performed at:  12:47 EDT  Timeout performed by:  Rushi Chasen//RN    PROCEDURE:  Block Start Time:  07/25/2021 12:47 PM    Patient Position:  sitting  Prep: Chloraprep    Needle: Block needle 22 G 2 in         Injection Technique:  single shot             Continuous ultrasound guidance was utilized for the procedure. Tissue planes, vascular and nerve structures were identified. Relevant anatomy for the intention of the block was noted. Ultrasound image was interpreted, captured, and stored. Nerve Stimulator          Complications:  blood not aspirated, no injection resistance, no paresthesia and no other event       See EMR for periprocedural vitals signs. Stable thoroughout unless otherwise documented      Block End Time:  07/25/2021 12:55 PM    Notes:   Risks and benefits of regional anesthesia including bleeding, infection, nerve damage and incomplete pain relief discussed with patient (and guardian when appropriate).  All  questions answered.  No pain on injection.  Needle and local spread well visualized with ultrasound.  EKG, Pulse oximeter, BP cuff placed on patient prior to procedure.  HD stable and comfortable throughout.  See nursing notes for times and vitals.  Time out performed prior to procedure.  I was present for this entire procedure          Staffing:  Anesthesiologist:  Bing Matter, MD        Performed by:  Anesthesiologist

## 2021-07-25 NOTE — Unmapped (Signed)
H&P reviewed, patient examined, no changes to H&P.    Alexandra Mcenery, MD

## 2021-07-25 NOTE — Unmapped (Signed)
[facility]  DEPARTMENT OF ANESTHESIOLOGY  PRE-PROCEDURAL EVALUATION    Alexandra [name] is a 50 y.o. year old female presenting for:    Procedure(s):  RIGHT ARTHROSCOPY SHOULDER , SUBACROMIAL DECOMPRESSION , OPEN ROTATOR CUFF REPAIR WITH REGENTEN PATCH    Surgeon:   Gayla Medicus, MD    Chief Complaint     Nontraumatic incomplete tear of right rotator cuff [M75.111]; Subacromial *    Review of Systems     Anesthesia Evaluation    Patient summary reviewed and nursing notes reviewed.       No history of anesthetic complications         Cardiovascular:    Exercise tolerance: good  Hypertension is well controlled.    (-) past MI, CAD, cardiomyopathy, CABG/stent, dysrhythmias, no hyperlipidemia.    Neuro/Muscoloskeletal/Psych:      (-) seizures, TIA, CVA.     Pulmonary:      (-) COPD, asthma, shortness of breath, recent URI, sleep apnea.       GI/Hepatic/Renal:      (-) GERD, hepatitis, liver disease, renal disease.    Endo/Other:        (-) diabetes mellitus, hypothyroidism, hyperthyroidism.       Past Medical History     Past Medical History:   Diagnosis Date   ??? Menarche     Age 23   ??? STD (sexually transmitted disease)     history of GC/trichomonas as teen       Past Surgical History     Past Surgical History:   Procedure Laterality Date   ??? CESAREAN SECTION  11/03/1989   ??? CYST REMOVAL     ??? TUBAL LIGATION  11/03/1994       Family History     Family History   Problem Relation Age of Onset   ??? Diabetes Father         NIDDM   ??? Diabetes Paternal Grandmother         IDDM   ??? Breast Cancer Neg Hx    ??? Colon Cancer Neg Hx    ??? Ovarian cancer Neg Hx        Social History     Social History     Socioeconomic History   ??? Marital status: Single     Spouse name: Not on file   ??? Number of children: Not on file   ??? Years of education: Not on file   ??? Highest education level: Not on file   Occupational History   ??? Not on file   Tobacco Use   ??? Smoking status: Never Smoker   ??? Smokeless tobacco: Never Used   Substance and Sexual  Activity   ??? Alcohol use: No   ??? Drug use: Yes     Types: Marijuana   ??? Sexual activity: Yes     Partners: Male     Birth control/protection: None   Other Topics Concern   ??? Caffeine Use Yes     Comment: 2L every other day   ??? Occupational Exposure No   ??? Exercise Yes   ??? Seat Belt Yes   Social History Narrative   ??? Not on file     Social Determinants of Health     Financial Resource Strain: Not on file   Physical Activity: Not on file   Stress: Not on file   Social Connections: Not on file   Housing Stability: Not on file       Medications  Allergies:  No Known Allergies    Home Meds:  Prior to Admission medications as of 07/18/21 1452   Medication Sig Taking?   amlodipine-olmesartan (AZOR) 5-20 mg per tablet Take 1 tablet by mouth daily. Yes   hydroCHLOROthiazide (HYDRODIURIL) 25 MG tablet Take 1 tablet (25 mg total) by mouth daily. Yes   acetaminophen (TYLENOL EXTRA STRENGTH) 500 MG tablet Take 1 tablet (500 mg total) by mouth every 6 hours as needed.    diclofenac (VOLTAREN) 50 MG EC tablet Take 1 tablet (50 mg total) by mouth 2 times a day.  Patient not taking: Reported on 07/18/2021    lidocaine (LIDODERM) 5 % Place 1 patch onto the skin daily. Apply patch for 12 hours and then remove patch and leave off for 12 hours.    methocarbamoL (ROBAXIN) 500 MG tablet Take 1 tablet (500 mg total) by mouth 3 times a day as needed.        Inpatient Meds:  Scheduled:   ??? midazolam  2 mg Intravenous Once   ??? ropivacaine (PF) 0.5%  30 mL PERINEURAL Once     Continuous:   ??? lactated Ringers         PRN: acetaminophen, ceFAZolin (ANCEF) IVPB, celecoxib, dextrose 10% in water **OR** dextrose 10% in water, gabapentin, glucose    Vital Signs     Wt Readings from Last 3 Encounters:   07/18/21 (!) 235 lb (106.6 kg)   06/27/21 (!) 236 lb (107 kg)   05/10/21 (!) 245 lb 4.8 oz (111.3 kg)     Ht Readings from Last 3 Encounters:   07/18/21 5' 2 (1.575 m)   06/27/21 5' 2 (1.575 m)   04/24/21 5' 2 (1.575 m)     Temp Readings from Last  3 Encounters:   02/17/20 99 ??F (37.2 ??C) (Oral)   01/09/14 97.8 ??F (36.6 ??C) (Oral)   12/16/13 96.8 ??F (36 ??C) (Oral)     BP Readings from Last 3 Encounters:   06/27/21 (!) 136/93   05/10/21 (!) 172/103   10/03/20 (!) 149/99     Pulse Readings from Last 3 Encounters:   06/27/21 74   10/03/20 67   07/31/20 70     @LASTSAO2 (3)@    Physical Exam     Airway:     Mallampati: II    Dental:   - No obvious cracked, loose, chipped, or missing teeth.     Pulmonary:   - normal exam     Cardiovascular:  - normal exam     Neuro/Musculoskeletal/Psych:      Abdominal:       Current OB Status:       Other Findings:        Laboratory Data     Lab Results   Component Value Date    WBC 4.9 05/22/2017    HGB 12.6 05/22/2017    HCT 38.2 05/22/2017    MCV 90.7 05/22/2017    PLT 293 05/22/2017       No results found for: [facility]    Lab Results   Component Value Date    GLUCOSE 92 06/27/2021    BUN 13 06/27/2021    CO2 26 06/27/2021    CREATININE 1.05 06/27/2021    K 3.9 06/27/2021    NA 140 06/27/2021    CL 103 06/27/2021    CALCIUM 10.1 06/27/2021    ALBUMIN 4.0 06/27/2021       No results found for: PTT, INR  Lab Results   Component Value Date    PREGTESTUR NEGATIVE 12/16/2013       Anesthesia Plan     ASA 2         Female and current non-smoker    Anesthesia Type:  general endotracheal and Pre-op pain block for post-op pain control.      PONV Risk Factors: female, current non-smoker,                  (Risks and benefits of regional anesthesia including bleeding, infection, nerve damage and incomplete pain relief discussed with patient (and guardian when appropriate).  All questions answered.  Discussion prior to block.  Pt agreed to proceed. (Written consent on chart)  )  Anesthetic plan and risks discussed with patient.    Plan, alternatives, and risks of anesthesia, including death, have been explained to and discussed with the patient/legal guardian.  By my assessment, the patient/legal guardian understands and agrees.  Scenario  presented in detail.  Questions answered.          Plan discussed with CRNA and RNSA.

## 2021-07-25 NOTE — Unmapped (Signed)
Anesthesia Post Note    Patient: Alexandra [name]    Procedure(s) Performed: Procedure(s):  RIGHT ARTHROSCOPY SHOULDER , SUBACROMIAL DECOMPRESSION , ROTATOR CUFF REPAIR    Anesthesia type: general endotracheal    Patient location: PACU    Airway: Patent    Post pain: Adequate analgesia    Nausea / Vomiting: Absent    Post-operative Hydration Status: Adequate    Post assessment: no apparent anesthetic complications    Last Vitals:   Vitals:    07/25/21 1527 07/25/21 1539 07/25/21 1547 07/25/21 1603   BP: (!) 129/110 155/84 156/88 156/86   BP Location:  Left arm Left arm Left arm   Patient Position:    Lying   Pulse: 59 56 53 56   Resp: 12 16 16 17    Temp:  97.4 ??F (36.3 ??C)     TempSrc:  Axillary     SpO2: 94% 93% 95% 97%   Weight:       Height:            Post vital signs: stable    Level of consciousness: awake    Complications:  There were no known complications for this encounter.

## 2021-07-25 NOTE — Unmapped (Signed)
Anesthesia Transfer of Care Note    Patient: Alexandra [name]  Procedure(s) Performed: Procedure(s):  RIGHT ARTHROSCOPY SHOULDER , SUBACROMIAL DECOMPRESSION , ROTATOR CUFF REPAIR    Patient location: PACU    Anesthesia type: general endotracheal    Airway Device on Arrival to PACU/ICU: Nasal Cannula    IV Access: Peripheral    Monitors Recommended to be Used During PACU/ICU: Standard Monitors    Outstanding Issues to Address: None    Level of Consciousness: awake    Post vital signs:    Vitals:    07/25/21 1300   BP: 155/101   Pulse: 73   Resp: 12   Temp: 97.4   SpO2: 96%       Complications:  No complications documented.    Date 07/24/21 0700 - 07/25/21 0659(Not Admitted) 07/25/21 0700 - 07/26/21 0659   Shift 0700-1459 1500-2259 2300-0659 24 Hour Total 0700-1459 1500-2259 2300-0659 24 Hour Total   INTAKE   I.V.(mL/kg)     400(3.8)   400(3.8)     Volume (mL) (lactated Ringers infusion)     400   400   Shift Total(mL/kg)     400(3.8)   400(3.8)   OUTPUT   Shift Total(mL/kg)           Weight (kg) 106.6 106.6 106.6 106.6 106.6 106.6 106.6 106.6

## 2021-07-31 ENCOUNTER — Ambulatory Visit
Payer: PRIVATE HEALTH INSURANCE | Attending: Rehabilitative and Restorative Service Providers" | Primary: Student in an Organized Health Care Education/Training Program

## 2021-08-07 ENCOUNTER — Ambulatory Visit
Admit: 2021-08-07 | Discharge: 2021-08-07 | Payer: PRIVATE HEALTH INSURANCE | Attending: Rehabilitative and Restorative Service Providers" | Primary: Student in an Organized Health Care Education/Training Program

## 2021-08-07 ENCOUNTER — Ambulatory Visit
Payer: PRIVATE HEALTH INSURANCE | Attending: Student in an Organized Health Care Education/Training Program | Primary: Student in an Organized Health Care Education/Training Program

## 2021-08-07 DIAGNOSIS — Z9889 Other specified postprocedural states: Secondary | ICD-10-CM

## 2021-08-07 NOTE — Unmapped (Signed)
[facility]  [place] [facility]    ORTHOPEDIC SURGERY AND SPORTS MEDICINE    [name]   May 17, 1971     Date of Exam:   08/07/2021    Date of Surgery:  07/25/2021  Surgical Procedure:  right shoulder arthroscopy with rotator cuff repair and subacromial decompression   Findings:   1. Superior Labrum/Biceps Tendon- The biceps tendon and superior labrum were intact.    2. Anterior/Posterior Labrum- The anterior and posterior labrum were intact without fraying.  3. Capsule: normal  4. Rotator Cuff- near full thickness articular tear of anterior supraspinatus  5. Inferior Axillary Recess- No loose bodies  6. Articular Surfaces- Intact with Grade 1 changes to the humeral head  7. Difffuse synovitis - absent    Alexandra [name] for the 2 week post-operative exam.  The patient is doing well with minimal complaints of pain.  Has started PT at [facility]. Patient arrives [name] without sling. Patient stated she did not think she needed the sling.  Denies numbness or tingling distally.     Exam:  The right shoulder is examined.  The patient???s incisions are healing well without signs of infection.  There is no erythema or drainage.  There is no swelling.  Range of motion is to 90 ?? of forward flexion and 20 ?? of external rotation. The arm is grossly neurovascularly intact distally.    Assesment: Status post right shoulder arthroscopy with rotator cuff repair and subacromial decompression     Plan:  We went over the surgical pictures with the patient [name].  Patient is doing ok at this point. They will continue with physical therapy working on passive range of motion. They will re-start to wear the sling. Sutures were removed at therapy. The patient will gradually wean off pain medications.  I will see the patient back in 4 weeks to check their progression.  Everything was explained in detail to the patient voiced understanding and agreement with the plan.    Arman Bogus, PA

## 2021-08-07 NOTE — Unmapped (Deleted)
[facility]  INTERNAL MEDICINE RESIDENT PRACTICE  [address] [place] [address]    Name:  Alexandra [name]  Date of Birth: 1971/06/05 (50 y.o.)   MRN: [id number]    Date of Service:  08/05/2021     Subjective  Chief Complaint:     f/u    HPI/Assessment/Plan:     Alexandra [name] is a 50 y.o. female with history of HTN, morbid obesity, b/l shoulder pain. Today we discussed the following:    - ***    Preop examination (Primary)  Assessment & Plan:  Getting right rotator cuff surgery under general anesthesia.     RCRI 0  Low risk surgery  No decompensations  Has tolerated general anesthesia before  No family members with hx of MI or CVA  No respiratory sxs     Patient is not in need of further cardiac testing or evaluation prior to surgery. She is at low risk for cardiac complications.        Primary hypertension  Assessment & Plan:  Patient with BP slightly over goal in office today but home BP are nearly normal except for diastolic. Will start olmesartan 20/amlo 5 combo pill and continue HCTZ 25.     Orders:  -     amlodipine-olmesartan (AZOR) 5-20 mg per tablet; Take 1 tablet by mouth daily.     Dyspnea on exertion  Assessment & Plan:  Patient still with some mild DOE but it is not limiting her. She has not started an exercise regimen yet like we discussed last visit. After discussing cardiac symptoms and exertional sxs, I am even less convinced now of a cardiac cause, instead I think this is deconditioning. Exam was benign. If she starts an exercise regimen and still has issues following that, a cardiopulmonary workup would be warranted.        Other orders  -     lidocaine (LIDODERM) 5 %; Place 1 patch onto the skin daily. Apply patch for 12 hours and then remove patch and leave off for 12 hours.         There are no diagnoses linked to this encounter.    Leave results on voicemail/Mychart/neither? ***  Health Maintenance Due   Topic Date Due   ??? Advance Directives  Never done   ??? Colorectal Cancer  Screening (MyChart)  Never done   ??? Immunization: COVID-19 (2 - Pfizer series) 03/10/2020   ??? Mammogram (MyChart)  Never done   ??? Immunization: Zoster (1 of 2) Never done   ??? Cervical Cancer Screening/Pap Smear (MyChart)  06/14/2021   ??? Immunization: Influenza (MyChart) (1) 07/04/2021       Current Outpatient Medications:  Current Outpatient Medications   Medication Sig Dispense Refill   ??? acetaminophen (TYLENOL EXTRA STRENGTH) 500 MG tablet Take 2 tablets (1,000 mg total) by mouth every 8 hours as needed. 90 tablet 1   ??? amlodipine-olmesartan (AZOR) 5-20 mg per tablet Take 1 tablet by mouth daily. 90 tablet 1   ??? hydroCHLOROthiazide (HYDRODIURIL) 25 MG tablet Take 1 tablet (25 mg total) by mouth daily. 90 tablet 3   ??? ibuprofen (MOTRIN) 600 MG tablet Take 1 tablet (600 mg total) by mouth every 8 hours. 60 tablet 1   ??? lidocaine (LIDODERM) 5 % Place 1 patch onto the skin daily. Apply patch for 12 hours and then remove patch and leave off for 12 hours. 30 patch 0   ??? methocarbamoL (ROBAXIN) 500 MG tablet  Take 1 tablet (500 mg total) by mouth 3 times a day as needed. 20 tablet 0   ??? ondansetron (ZOFRAN) 4 MG tablet Take 1 tablet (4 mg total) by mouth every 8 hours as needed for Nausea. 12 tablet 0   ??? senna-docusate (SENNOSIDES-DOCUSATE SODIUM) 8.6-50 mg per tablet Take 1 tablet by mouth every 12 hours as needed for Constipation. 60 tablet 0     No current facility-administered medications for this visit.       ROS:     As noted in HPI    Objective:   There were no vitals filed for this visit.     There is no height or weight on file to calculate BMI.    Physical Exam***    Follow Up:     No follow-ups on file.    Future Appointments   Date Time Provider Department Center   08/07/2021 11:10 AM Arman Bogus, PA [facility]   08/07/2021  1:20 PM Vivianne Spence, MD [facility]       Pre-visit planning performed.  Discussed self management and treatment goals with patient.   Barriers to meeting these goals were  discussed.  Information regarding the usage and side effects of any new medication were provided.       Vivianne Spence, MD

## 2021-08-13 ENCOUNTER — Encounter
Admit: 2021-08-13 | Payer: PRIVATE HEALTH INSURANCE | Attending: Family | Primary: Student in an Organized Health Care Education/Training Program

## 2021-08-13 MED ORDER — oxyCODONE (ROXICODONE) 5 MG immediate release tablet
5 | ORAL_TABLET | Freq: Three times a day (TID) | ORAL | 0 refills | 6.00000 days | Status: AC | PRN
Start: 2021-08-13 — End: 2021-08-20

## 2021-08-13 NOTE — Unmapped (Signed)
Patient left before being called back to exam room   Chart prep - Opened office visit instead of using an orders only encounter. Error Please Disregard

## 2021-08-19 ENCOUNTER — Ambulatory Visit
Admit: 2021-08-19 | Discharge: 2021-08-19 | Payer: PRIVATE HEALTH INSURANCE | Attending: Student in an Organized Health Care Education/Training Program | Primary: Student in an Organized Health Care Education/Training Program

## 2021-08-19 DIAGNOSIS — Z0000 Encounter for general adult medical examination without abnormal findings: Secondary | ICD-10-CM

## 2021-08-19 NOTE — Unmapped (Signed)
I did not see or examine the patient. I discussed with the resident and agree with Alfonse Flavors, MD's findings and plan as documented in the resident's note.    Donita Newland ANN Siah Steely, MD     50 yo F with HTN, shoulder pain, obesity here for Follow up.     HTN  At last visit, BP was above goal, so started olmesartan-amlodipine 20-5 mg. She reports adherence.   A/P: BP at goal now.  - Continue current medications    BMI 42  Has been referred to [facility] in the past. She's very sedentary and isn't ready to increase physical activity.     Health maintenance  - Declined flu shot  - Cologuard expired. Reorder.   - Return in 1 month for pap smear.

## 2021-08-19 NOTE — Unmapped (Signed)
[facility]  Follow Up Note       CC/Reason for visit:     Alexandra [name] is a 50 y.o. female with PMHx as below presenting for follow-up    Past Medical History:   Diagnosis Date   ??? Menarche     Age 42   ??? STD (sexually transmitted disease)     history of GC/trichomonas as teen     Health maintainence due:  Health Maintenance Due   Topic Date Due   ??? Advance Directives  Never done   ??? Colorectal Cancer Screening (MyChart)  Never done   ??? Immunization: COVID-19 (2 - Pfizer series) 03/10/2020   ??? Mammogram (MyChart)  Never done   ??? Immunization: Zoster (1 of 2) Never done   ??? Cervical Cancer Screening/Pap Smear (MyChart)  06/14/2021   ??? Immunization: Influenza (MyChart) (1) 07/04/2021     HPI/Assessment/Plan     50 y.o. PMH history of HTN, morbid obesity, b/l shoulder pain here for follow-up.    Last clinic visit 06/27/21 with Dr. Dan Humphreys. Started Olmesartan 20/Amlo 5 combo pill    #HTN - Last visit started Olmesartan 20/Amlo 5 combo pill, continued HCTZ 25.  BP Readings from Last 3 Encounters:   08/19/21 120/73   07/25/21 156/86   06/27/21 (!) 136/93   - Continue regimen above   - Has good control of BP    #S/p R shoulder arthroscopy w rotator cuff repair - Got this surgery last month. Healed well, working with PT. Following with Ortho  - Continue PT, continue to follow with Ortho    #BMI 44   - Has been referred to Bariatric surgery in the past but currently is not interested  - Encouraged exercise/healthy diet    #HM  - Declined flu shot  - Encouraged to schedule mammogram  - Received Cologuard in mail but it expired; needs another  - Zoster, COVID  - Will schedule follow-up visit for pap      Orders Placed This Encounter   ??? Mammography Screening Bilateral incl CAD   ??? Influenza Vaccine (quadravalent, age < 88)     Follow up in 1 month for pap    Future Appointments   Date Time Provider Department Center   09/04/2021 12:50 PM Arman Bogus, PA [facility]       Pre-visit planning  performed.  Discussed self management and treatment goals with patient.   Barriers to meeting these goals were discussed.  Information regarding the usage and side effects of any new medication were provided.    Alfonse Flavors  Internal Medicine Resident    Medical, Surgical, Social and Family History:     I have reviewed the patient???s medical history with the patient and updated any pertinent past medical, family and social history in the EMR.      Current Outpatient Medications:     Current Outpatient Medications   Medication Sig   ??? acetaminophen Take 2 tablets (1,000 mg total) by mouth every 8 hours as needed.   ??? amlodipine-olmesartan Take 1 tablet by mouth daily.   ??? hydroCHLOROthiazide Take 1 tablet (25 mg total) by mouth daily.   ??? ibuprofen Take 1 tablet (600 mg total) by mouth every 8 hours.   ??? lidocaine Place 1 patch onto the skin daily. Apply patch for 12 hours and then remove patch and leave off for 12 hours.   ??? methocarbamoL Take 1 tablet (500 mg total) by mouth  3 times a day as needed.   ??? ondansetron Take 1 tablet (4 mg total) by mouth every 8 hours as needed for Nausea.   ??? oxyCODONE Take 1 tablet (5 mg total) by mouth every 8 hours as needed for Pain for up to 7 days.   ??? senna-docusate Take 1 tablet by mouth every 12 hours as needed for Constipation.     No current facility-administered medications for this visit.       Diabetes Medications as of 08/19/2021             amlodipine-olmesartan (AZOR) 5-20 mg per tablet Take 1 tablet by mouth daily.          Objective Data:   BP 120/73 (BP Location: Left arm, Patient Position: Sitting, BP Cuff Size: Regular)    Pulse 78    Ht 5' 2 (1.575 m)    Wt (!) 234 lb (106.1 kg)    LMP 05/03/2021 (Approximate)    BMI 42.80 kg/m??     BP Readings from Last 6 Encounters:   08/19/21 120/73   07/25/21 156/86   06/27/21 (!) 136/93   05/10/21 (!) 172/103   10/03/20 (!) 149/99   07/31/20 140/76       Wt Readings from Last 6 Encounters:   08/19/21 (!) 234 lb (106.1 kg)    08/07/21 (!) 235 lb (106.6 kg)   07/18/21 (!) 235 lb (106.6 kg)   06/27/21 (!) 236 lb (107 kg)   05/10/21 (!) 245 lb 4.8 oz (111.3 kg)   04/24/21 (!) 245 lb (111.1 kg)       Focused physical exam  Physical Exam  BP 120/73 (BP Location: Left arm, Patient Position: Sitting, BP Cuff Size: Regular)    Pulse 78    Ht 5' 2 (1.575 m)    Wt (!) 234 lb (106.1 kg)    LMP 05/03/2021 (Approximate)    BMI 42.80 kg/m??     General Appearance:    Alert, cooperative, no distress, appears stated age   Head:    Normocephalic, without obvious abnormality, atraumatic   Eyes:    PERRL, conjunctiva/corneas clear, EOM's intact, fundi     benign, both eyes   Ears:    Normal TM's and external ear canals, both ears   Nose:   Nares normal, septum midline, mucosa normal, no drainage     or sinus tenderness   Throat:   Lips, mucosa, and tongue normal; teeth and gums normal   Neck:   Supple, symmetrical, trachea midline, no adenopathy;     thyroid:  no enlargement/tenderness/nodules; no carotid    bruit or JVD   Back:     Symmetric, no curvature, ROM normal, no CVA tenderness   Lungs:     Clear to auscultation bilaterally, respirations unlabored   Chest Wall:    No tenderness or deformity    Heart:    Regular rate and rhythm, S1 and S2 normal, no murmur, rub    or gallop       Abdomen:     Soft, non-tender, bowel sounds active all four quadrants,     no masses, no organomegaly           Extremities:   R extremity in sling   Pulses:   2+ and symmetric all extremities   Skin:   Skin color, texture, turgor normal, no rashes or lesions   Lymph nodes:   Cervical, supraclavicular, and axillary nodes normal   Neurologic:   CNII-XII  intact, normal strength, sensation and reflexes     throughout     DM Labs:    Lab Results   Component Value Date    HGBA1C 5.3 05/10/2021       No results found for: Concepcion Elk    Screening Labs:    Lab Results   Component Value Date    HCVAB Nonreactive 02/17/2020    HGB 12.6 05/22/2017    HGBA1C 5.3  05/10/2021    LDL 57 02/17/2020    TSH 1.70 02/17/2020     The 10-year ASCVD risk score (Arnett DK, et al., 2019) is: 2%    Values used to calculate the score:      Age: 63 years      Sex: Female      Is Non-Hispanic African American: Yes      Diabetic: No      Tobacco smoker: No      Systolic Blood Pressure: 120 mmHg      Is BP treated: Yes      HDL Cholesterol: 62 mg/dL      Total Cholesterol: 156 mg/dL

## 2021-08-19 NOTE — Unmapped (Addendum)
Plan for this visit:  - Continue exercising regularly/healthy diet to try to lost weight  - Call [phone] to schedule your mammogram  - Come back for your pap smear    Thank you for coming in today!    Schedule follow up in 1 month with Linus Salmons for pap    Future Appointments   Date Time Provider Department Center   09/04/2021 12:50 PM Arman Bogus, PA [facility]       Call us at [phone] to schedule appointments or any questions.    Your feedback is the most helpful information we receive to make changes within our practice. You can give feedback by completing surveys you receive or by calling our patient experience department. Thank you for your help!

## 2021-09-04 ENCOUNTER — Ambulatory Visit
Admit: 2021-09-04 | Discharge: 2021-09-04 | Payer: PRIVATE HEALTH INSURANCE | Attending: Rehabilitative and Restorative Service Providers" | Primary: Student in an Organized Health Care Education/Training Program

## 2021-09-04 DIAGNOSIS — Z9889 Other specified postprocedural states: Secondary | ICD-10-CM

## 2021-09-04 NOTE — Unmapped (Signed)
[name]  [place] [facility]    ORTHOPEDIC SURGERY AND SPORTS MEDICINE    [name]   Jan 15, 1971     Date of Exam:   09/04/2021    Date of Surgery:  07/25/2021  Surgical Procedure:  right shoulder arthroscopy with rotator cuff repair and subacromial decompression   Findings:   1. Superior Labrum/Biceps Tendon- The biceps tendon and superior labrum were intact.    2. Anterior/Posterior Labrum- The anterior and posterior labrum were intact without fraying.  3. Capsule: normal  4. Rotator Cuff- near full thickness articular tear of anterior supraspinatus  5. Inferior Axillary Recess- No loose bodies  6. Articular Surfaces- Intact with Grade 1 changes to the humeral head  7. Difffuse synovitis - absent    [name] returns today for the 6 week post-operative exam.  The patient is doing well with minimal complaints of pain.  Has continued [facility] at [name]. Compliant with sling since last visit.  Denies numbness or tingling distally.     Exam:  The right shoulder is examined.  The patient???s incisions are healing well without signs of infection.  There is no erythema or drainage.  There is no swelling.  Range of motion is to 100 ?? of forward flexion and 20 ?? of external rotation. OK strength with external rotation. The arm is grossly neurovascularly intact distally.    Assesment: Status post right shoulder arthroscopy with rotator cuff repair and subacromial decompression     Plan:  Patient is doing ok at this point. They will continue with physical therapy working now on active range of motion and some light strengthening. Can discontinue sling. NO lifting above 5 lbs. No repetitive overhead activities. I will see the patient back in 4-6 weeks to check their progression.  Everything was explained in detail to the patient voiced understanding and agreement with the plan.    Arman Bogus, PA

## 2021-09-05 MED ORDER — traMADoL (ULTRAM) 50 mg tablet
50 | ORAL_TABLET | Freq: Three times a day (TID) | ORAL | 0 refills | Status: AC | PRN
Start: 2021-09-05 — End: 2021-09-12

## 2021-09-09 MED ORDER — amlodipine-olmesartan (AZOR) 5-20 mg per tablet
5-20 | ORAL_TABLET | Freq: Every day | ORAL | 1 refills | Status: AC
Start: 2021-09-09 — End: ?

## 2021-09-19 ENCOUNTER — Ambulatory Visit
Admit: 2021-09-19 | Discharge: 2021-09-25 | Payer: PRIVATE HEALTH INSURANCE | Attending: Family | Primary: Student in an Organized Health Care Education/Training Program

## 2021-09-19 DIAGNOSIS — Z01419 Encounter for gynecological examination (general) (routine) without abnormal findings: Secondary | ICD-10-CM

## 2021-09-19 LAB — HPV HIGH RISK WITH GENOTYPING
HPV DNA High Risk Oth: NEGATIVE
HPV Genotype 16: NEGATIVE
HPV Genotype 18: NEGATIVE

## 2021-09-19 LAB — CHLAMYDIA/GONORRHOEAE DNA THIN PREP
Chlamydia Trachomatis DNA: NEGATIVE
Neisseria Gonorrhoeae DNA: NEGATIVE

## 2021-09-19 NOTE — Unmapped (Signed)
Pap today  Mammogram scheduled in Dec  Given customer service number for Cologuard to check viability of her kit she has at home  Declines flu vaccine  Discussed shingles vaccine

## 2021-09-19 NOTE — Unmapped (Signed)
Pap vial labeled and placed in outgoing lab bin.

## 2021-09-19 NOTE — Unmapped (Signed)
[facility] - INTERNAL MEDICINE RESIDENT PRACTICE  Phone: [phone]    Name:  Alexandra [name]  Date of Birth: [birth year](50 y.o.)    Subjective:     Chief Complaint   Patient presents with   ??? Gynecologic Exam     Here for pap, has some vaginal itching, no rash or discharge   ??? Medication Problem     Having trouble getting bp med     LMP 2 years ago then one this summer  Abnormal pap in 30's per patient - but HSIL on chart  Vaginal itching recently    [name] to pharmacy early November to get her Azor but could not get it. A refill sent 11/7 and looks like it would have been too soon to fill it. We reviewed and discussed setting up autorefills and talking with pharmacy about consolidating refills.     Has old Cologuard at home but thought she could not use it. Didn't get a new one sent with last order placed.     Review of Systems    Current Outpatient Medications   Medication Sig   ??? acetaminophen Take 2 tablets (1,000 mg total) by mouth every 8 hours as needed.   ??? amlodipine-olmesartan Take 1 tablet by mouth daily.   ??? hydroCHLOROthiazide Take 1 tablet (25 mg total) by mouth daily.   ??? ibuprofen Take 1 tablet (600 mg total) by mouth every 8 hours.   ??? lidocaine Place 1 patch onto the skin daily. Apply patch for 12 hours and then remove patch and leave off for 12 hours.   ??? methocarbamoL Take 1 tablet (500 mg total) by mouth 3 times a day as needed.   ??? ondansetron Take 1 tablet (4 mg total) by mouth every 8 hours as needed for Nausea.   ??? senna-docusate Take 1 tablet by mouth every 12 hours as needed for Constipation.     No current facility-administered medications for this visit.     Objective:   BP 120/69    Pulse 67    Ht 5' 2 (1.575 m)    Wt (!) 236 lb (107 kg)    LMP 05/03/2021 (Approximate)    BMI 43.16 kg/m??     Physical Exam  Vitals reviewed.   Constitutional:       Appearance: Normal appearance.   HENT:      Head: Normocephalic and atraumatic.   Cardiovascular:      Heart sounds: Normal heart  sounds.   Pulmonary:      Effort: Pulmonary effort is normal.      Breath sounds: Normal breath sounds.   Genitourinary:     General: Normal vulva.      Exam position: Lithotomy position.      Labia:         Right: No rash or lesion.         Left: No rash or lesion.       Vagina: Normal.      Cervix: Friability and erythema present.      Rectum: Normal.      Comments: Unable to palpate uterus or ovaries   Musculoskeletal:      Cervical back: Neck supple.   Neurological:      Mental Status: She is alert and oriented to person, place, and time.   Psychiatric:         Mood and Affect: Mood normal.         Behavior: Behavior normal.  Thought Content: Thought content normal.         Judgment: Judgment normal.           Assessment/Plan     Healthcare maintenance  Pap today  Mammogram scheduled in Dec  Given customer service number for Cologuard to check viability of her kit she has at home  Declines flu vaccine  Discussed shingles vaccine    Post-menopausal bleeding  TVUS/pelvic ultrasound  Pap  If occurs again let us know asap    No follow-ups on file.    No LOS data to display    No LOS data to display

## 2021-09-19 NOTE — Unmapped (Signed)
TVUS/pelvic ultrasound  Pap  If occurs again let us know asap

## 2021-09-19 NOTE — Unmapped (Addendum)
[facility] at [phone] about Cologuard test       Schedule ultrasound to check your uterus since you had that bleeding this summer [id number]    If you have more bleeding call me

## 2021-09-25 MED ORDER — metroNIDAZOLE (FLAGYL) 500 MG tablet
500 | ORAL_TABLET | Freq: Two times a day (BID) | ORAL | 0 refills | Status: AC
Start: 2021-09-25 — End: 2021-10-02

## 2021-10-08 NOTE — Unmapped (Signed)
Received call from [facility] in [place] transferred me patient and scheduled COLPO.

## 2021-10-16 ENCOUNTER — Ambulatory Visit
Payer: PRIVATE HEALTH INSURANCE | Attending: Rehabilitative and Restorative Service Providers" | Primary: Student in an Organized Health Care Education/Training Program

## 2021-10-17 ENCOUNTER — Ambulatory Visit
Admit: 2021-10-17 | Discharge: 2021-10-17 | Payer: PRIVATE HEALTH INSURANCE | Attending: Rehabilitative and Restorative Service Providers" | Primary: Student in an Organized Health Care Education/Training Program

## 2021-10-17 DIAGNOSIS — Z9889 Other specified postprocedural states: Secondary | ICD-10-CM

## 2021-10-17 NOTE — Unmapped (Signed)
[facility]  [place] [facility]    ORTHOPEDIC SURGERY AND SPORTS MEDICINE    FYNN ADEL   03-16-1971     Date of Exam:   10/17/2021    Date of Surgery:  07/25/2021  Surgical Procedure:  right shoulder arthroscopy with rotator cuff repair and subacromial decompression   Findings:   1. Superior Labrum/Biceps Tendon- The biceps tendon and superior labrum were intact.    2. Anterior/Posterior Labrum- The anterior and posterior labrum were intact without fraying.  3. Capsule: normal  4. Rotator Cuff- near full thickness articular tear of anterior supraspinatus  5. Inferior Axillary Recess- No loose bodies  6. Articular Surfaces- Intact with Grade 1 changes to the humeral head  7. Difffuse synovitis - absent    [place] returns today for the almost 3 month post-operative exam.  Patient stated she is getting better, although slowly. Pain worse at night time. Has continued PT at [facility]. Out of sling.  Denies numbness or tingling distally.     Exam:  The right shoulder is examined.  The patient???s incisions are healing well without signs of infection.  There is no erythema or drainage.  There is no swelling.  Range of motion is to 100 ?? of forward flexion and 20 ?? of external rotation. 4/5 strength with external rotation and abduction in scapular plane. The arm is grossly neurovascularly intact distally.    Assesment: Status post right shoulder arthroscopy with rotator cuff repair and subacromial decompression     Plan:  Patient is doing ok at this point. Patient still dealing with some stiffness at this point. We discussed options of continuing with physical therapy and trial of intraarticular injection. Patient chose to continue with therapy for now. I would like to see her back in 6 weeks. Depending on how she is doing at that time, we may again discuss injection. Everything was explained in detail to the patient voiced understanding and agreement with the plan.    Arman Bogus, PA

## 2021-10-29 ENCOUNTER — Ambulatory Visit: Payer: PRIVATE HEALTH INSURANCE | Primary: Student in an Organized Health Care Education/Training Program

## 2021-11-11 ENCOUNTER — Ambulatory Visit: Payer: PRIVATE HEALTH INSURANCE | Primary: Student in an Organized Health Care Education/Training Program

## 2021-11-15 ENCOUNTER — Ambulatory Visit
Admit: 2021-11-15 | Discharge: 2021-11-21 | Payer: PRIVATE HEALTH INSURANCE | Primary: Student in an Organized Health Care Education/Training Program

## 2021-11-15 ENCOUNTER — Ambulatory Visit
Admit: 2021-11-15 | Payer: PRIVATE HEALTH INSURANCE | Primary: Student in an Organized Health Care Education/Training Program

## 2021-11-15 DIAGNOSIS — R87610 Atypical squamous cells of undetermined significance on cytologic smear of cervix (ASC-US): Secondary | ICD-10-CM

## 2021-11-15 LAB — BACTERIAL VAGINOSIS PANEL
Candida Species: NEGATIVE
Gardnerella vaginosis: NEGATIVE
Trichomonas vaginosis: NEGATIVE

## 2021-11-15 NOTE — Unmapped (Signed)
Patient received discharge instructions and AVS. Patient knows she should receive a call with results within 2 weeks. All questions answered. Patient was given our phone number if she has any further questions or concerns. Patient agreed and expressed understanding.

## 2021-11-15 NOTE — Unmapped (Signed)
Colposcopy Procedure Note    Clinic Date:  11/15/2021    Referred By:  Alycia Rossetti, MD  [address] [facility]  General Medicine  [place],  [place] [address]    Reason for Colposcopy:    The patient had a Pap smear on 09/20/21 which revealed ASCUS, HPV-, trich +.    Indications: 51 y.o.G3P3 with abnormal Pap described above here today for colposcopy and possible biopsy.      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 Remote history gonorrhea, trichomonas, syphilis. Last HIV testing 05/22/17, negative    Immune suppression: She denies being otherwise immunocompromised or on immunosuppressive medications.     Smoking: She reports that she does not smoke tobacco. Counseling provided regarding link between HPV infection persistence and tobacco use. Assistance with cessation offered.    BCM: Postmenopausal    Vaccine: Patient is 51 y.o. and therefore would not be a candidate for HPV vaccination.       PROCEDURE:    The procedure was explained to the patient.  We discussed the risks of infections.  She had no significant questions.    A signed consent was obtained from the patient.    She denied allergy to local anesthesia, latex or iodine.    Physical Exam  Genitourinary:               COLPOSCOPY:    The vulva and vagina appeared entirely normal.  No lesions of VIN or VAIN were seen.      Please refer to the images of the findings on the cervix in the patient's chart.  With saline, with and without the Green filter, the cervix showed:    General: Nabothian cyst at 6 o'clock, no bleeding, abnormal discharge, masses,or lesions    Abnormal color, vessels or contour suggesting invasive disease:     WITH ACETIC ACID:    The new squamocolumnar junction was seen in its entirety.  The original squamocolumnar junction was seen.    This was a complete colposcopy.    Lesions Seen: nabothian  cyst    Margins: Flocculated or feathered margins = 0    Punctation: fine    Mosaicism: none    Abnormal vessels: Ill- defined areas of fine punctation or mosaicism = 0    Pap smear: no    ECC: Yes     Biopsy: yes X 2 3:30 o'clock, 6 o'clock    DNA human papilloma virus probe: no    REID SCORE: 0-2 LOW GRADE ( LSIL)    IMPRESSION:  Normal and LSIL    PATIENT TOLERANCE:  The patient tolerated the colposcopy extremely well.        Plan:  Will call patient with pathology results and follow-up.  Patient instructed to call in 2 weeks for results if we have not contacted her.    TOC test for Trich      Lawerance Cruel, MD PGY-1  Obstetrics & Gynecology    I was present for colposcopy.    This note was completely edited, written and reviewed by me and consists of information cut and pasted from the my most recent visit, my smart phrases and other Epic tools. I have personally reviewed all aspects of this note to at least include reviewing this patient's chart and problem list, updating  the history, physical exam, lab and procedure results, and assessment and plan as detailed above and below.  As such this visit note reflects my current evaluation and management for this patient.    I saw and personally examined the patient today with my resident. I discussed the findings and therapeutic plan with the patient. I repeated, reviewed and agree with the history of present illness, past medical histories, family history, social history, medication list, and allergies as listed. The review of systems is as noted above. My physical exam confirms the findings listed above. Review of labs, pathology reports, radiograph reports, and medical records confirm the findings noted above. I agree with the assessment and plan as noted above. I have edited the note where appropriate.     I have personally performed a face to face diagnostic evaluation on this patient, seen initially by the resident. I have personally developed the care plan  as outlined in the Assessment and Plan.      Complexity: moderate     I spent a total of 20 minutes face-to-face of which >50% was spent in counseling and/or coordination of care, documentation and counseling.with patient and/or family.      Topics discussed include std testing.      Warner Mccreedy, MD    Division of Gynecologic Oncology  [phone]      Medical Decision Making:  The following items were considered in medical decision making:  Review / order clinical lab tests          Warner Mccreedy, MD  Gynecologic Oncology  Office: [phone]

## 2021-11-15 NOTE — Unmapped (Signed)
Colposcopy, Care After  This sheet gives you information about how to care for yourself after your procedure. Your doctor may also give you more specific instructions. If you have problems or questions, contact your doctor.  What can I expect after the procedure?  If you did not have a tissue sample removed (did not have a biopsy), you may only have some spotting for a few days. You can go back to your normal activities.  If you had a tissue sample removed, it is common to have:  Soreness and pain. This may last for a few days.  Light-headedness.  Mild bleeding from your vagina or dark-colored, grainy discharge from your vagina. This may last for a few days. You may need to wear a sanitary pad.  Spotting for at least 48 hours after the procedure.  Follow these instructions at home:  Take over-the-counter and prescription medicines only as told by your doctor. Ask your doctor what medicines you can start taking again. This is very important if you take blood-thinning medicine.  Do not drive or use heavy machinery while taking prescription pain medicine.  For 3 days, or as long as your doctor tells you, avoid:  Douching.  Using tampons.  Having sex.  If you use birth control (contraception), keep using it.  Limit activity for the first day after the procedure. Ask your doctor what activities are safe for you.  It is up to you to get the results of your procedure. Ask your doctor when your results will be ready.  Keep all follow-up visits as told by your doctor. This is important.  Contact a doctor if:  You get a skin rash.  Get help right away if:  You are bleeding a lot from your vagina. It is a lot of bleeding if you are using more than one pad an hour for 2 hours in a row.  You have clumps of blood (blood clots) coming from your vagina.  You have a fever.  You have chills  You have pain in your lower belly (pelvic area).  You have signs of infection, such as vaginal discharge that is:  Different than  usual.  Yellow.  Bad-smelling.  You have very pain or cramps in your lower belly that do not get better with medicine.  You feel light-headed.  You feel dizzy.  You pass out (faint).  Summary  If you did not have a tissue sample removed (did not have a biopsy), you may only have some spotting for a few days. You can go back to your normal activities.  If you had a tissue sample removed, it is common to have mild pain and spotting for 48 hours.  For 3 days, or as long as your doctor tells you, avoid douching, using tampons and having sex.  Get help right away if you have bleeding, very bad pain, or signs of infection.  This information is not intended to replace advice given to you by your health care provider. Make sure you discuss any questions you have with your health care provider.  Document Released: 04/07/2008 Document Revised: 07/09/2016 Document Reviewed: 07/09/2016  Elsevier Interactive Patient Education ?? 2018 Elsevier Inc.

## 2021-11-21 NOTE — Unmapped (Addendum)
Colposcopy Clinic Results Note    Patient is a 51 y.o. G3P3 who presented for colposcopy after abnormal pap smear on 09/20/21 that  showed ASCUS HPV-.     Colposcopic examination showed two areas of acetowhite change and biopsies were taken. Pathology results are as follows:    A. ?? Cervix, biopsies at 03:30:   - Ectocervical mucosa.   - No transition zone.   - No HPV or dysplasia.     B. ?? Cervix biopsies, 6 o'clock:   - Fragment of transition zone with HPV change.   - No dysplasia.     C. ?? ECC:   - Fragments of benign endocervical epithelium.   - Scant ectocervical epithelium with HPV change.   - No dysplasia.     Assessment/Plan:  Per ASCCP guidelines, the patient should have co-testing in one year.    Please call the patient after the management plan is confirmed with Dr. Jean RosenthalJackson.     Lawerance CruelANNABELLE Tama Grosz, MD PGY-1  Obstetrics & Gynecology    I agree with the above plan as described by the resident/NP.  I [facility] to assist with communicating results and plan to patient and to referring office.       Warner MccreedyAmanda Jackson, MD    Division of Gynecologic Oncology  [phone]  ??

## 2021-11-22 NOTE — Unmapped (Signed)
Spoke with patient and informed her of colposcopy results and disposition. She verbalized understanding and had no further questions.   Letter sent to referring provider.

## 2021-11-27 ENCOUNTER — Ambulatory Visit
Payer: PRIVATE HEALTH INSURANCE | Attending: Rehabilitative and Restorative Service Providers" | Primary: Student in an Organized Health Care Education/Training Program

## 2021-12-04 ENCOUNTER — Ambulatory Visit
Admit: 2021-12-04 | Discharge: 2021-12-04 | Payer: PRIVATE HEALTH INSURANCE | Attending: Rehabilitative and Restorative Service Providers" | Primary: Student in an Organized Health Care Education/Training Program

## 2021-12-04 DIAGNOSIS — Z4789 Encounter for other orthopedic aftercare: Secondary | ICD-10-CM

## 2021-12-04 MED ORDER — triamcinolone acetonide (KENALOG-40) injection 80 mg
40 | Freq: Once | INTRAMUSCULAR | Status: AC
Start: 2021-12-04 — End: 2021-12-04
  Administered 2021-12-04: 19:00:00 80 mg via INTRA_ARTICULAR

## 2021-12-04 MED ORDER — ibuprofen (MOTRIN) 600 MG tablet
600 | ORAL_TABLET | ORAL | 1 refills | Status: AC
Start: 2021-12-04 — End: 2022-02-15

## 2021-12-04 MED ORDER — bupivacaine (PF)(SENSORCAINE/MARCAINE) 0.5% injection
0.5 | Freq: Once | INTRAMUSCULAR | Status: AC
Start: 2021-12-04 — End: 2021-12-04
  Administered 2021-12-04: 19:00:00 3 mL via SUBCUTANEOUS

## 2021-12-04 NOTE — Unmapped (Signed)
[facility]  [place] [facility]    ORTHOPEDIC SURGERY AND SPORTS MEDICINE    Alex[name]   [birth year]     Date of Exam:   12/04/2021    Date of Surgery:  07/25/2021  Surgical Procedure:  right shoulder arthroscopy with rotator cuff repair and subacromial decompression   Findings:   1. Superior Labrum/Biceps Tendon- The biceps tendon and superior labrum were intact.    2. Anterior/Posterior Labrum- The anterior and posterior labrum were intact without fraying.  3. Capsule: normal  4. Rotator Cuff- near full thickness articular tear of anterior supraspinatus  5. Inferior Axillary Recess- No loose bodies  6. Articular Surfaces- Intact with Grade 1 changes to the humeral head  7. Difffuse synovitis - absent    [name] returns today for the 4+ month post-operative exam.  Patient stated she is getting better, although slowly. Pain worse at night time. Has continued PT at [facility]. Out of sling. Last visit due to some stiffness, I had talked to patient about an intraarticular injection but she declined at the time.  Denies numbness or tingling distally.     Exam:  The right shoulder is examined.  The patient???s incisions are healing well without signs of infection.  There is no erythema or drainage.  There is no swelling.  Range of motion is to 100 ?? of forward flexion and 45?? of external rotation, about 80 contralateral. Internal rotation to L3 (contralateral T6). 4/5 strength with external rotation and abduction in scapular plane. The arm is grossly neurovascularly intact distally.    Assesment: Status post right shoulder arthroscopy with rotator cuff repair and subacromial decompression     Plan:  Patient is doing ok at this point. Patient still dealing with some stiffness. We discussed options of continuing with physical therapy and trial of intraarticular injection which patient is ok with. Procedure note below. New PT script given as she wanted to go to [facility] of [facility]. I would like to  see her back in 6 weeks. Depending on how she is doing at that time, we may discuss LOA and manipulation. Everything was explained in detail to the patient voiced understanding and agreement with the plan.    Procedure Note:  After explanation of the risks, benefits, and alternatives, the patient consented for intra-articular steroid injection of the right shoulder. The posterior aspect of the shoulder was prepped with Betadine. The shoulder joint was then sterilely injected with 80 mg of Kenalog and 3 mL of Marcaine from a posterior access point. The wound was cleansed with alcohol and dressed with a Band-Aid. The patient tolerated the procedure well without any complication.    Arman Bogus, PA

## 2021-12-04 NOTE — Unmapped (Signed)
Right intra articular Cortisone injection drawn, timeout completed,  patient prepped and assisted physician with injection.   Allergies were reviewed.  Pt cleaned with alcohol and band aid applied.  Procedure explained to pt and all questions answered.    A verbal consent was obtained and a verbal timeout was performed on 12/04/2021 at 1:18 PM.      Durwin Regesheresa Ansley Stanwood, MA

## 2021-12-10 ENCOUNTER — Ambulatory Visit: Payer: PRIVATE HEALTH INSURANCE | Primary: Student in an Organized Health Care Education/Training Program

## 2021-12-11 MED ORDER — ondansetron (ZOFRAN) 4 MG tablet
4 | ORAL_TABLET | Freq: Three times a day (TID) | ORAL | 0 refills | Status: AC | PRN
Start: 2021-12-11 — End: 2022-01-20

## 2021-12-11 NOTE — Unmapped (Signed)
Medication refill sent to [facility]. Will send MyChart message stating this to patient.

## 2021-12-13 ENCOUNTER — Inpatient Hospital Stay
Admit: 2021-12-13 | Payer: PRIVATE HEALTH INSURANCE | Attending: Family | Primary: Student in an Organized Health Care Education/Training Program

## 2021-12-13 DIAGNOSIS — N950 Postmenopausal bleeding: Secondary | ICD-10-CM

## 2022-01-06 NOTE — Unmapped (Signed)
Pt calling to confirm appt with PCP on 3/16 regarding concerns mentioned in my chart message. Provider did not advise pt to be seen in an acute/urgent appt.   This RN did offer for pt to be seen sooner by non PCP, but pt declines wanting to be seen by PCP only.     Future Appointments   Date Time Provider Department Center   01/15/2022  1:20 PM Arman Bogus, PA [facility] SPRT HOL HOL   01/16/2022  1:40 PM Genevie Cheshire, MD [facility]OX   04/10/2022 10:10 AM [facility] 1 [facility]       Appointment date, time and provider information given to patient/caregiver. Voiced understanding.    Patient/Caregiver advised to arrive 15 minutes early for check in process.   Advised to call with any cancellation or rescheduling needs.      All question have been answered by this RN.   Pt/Caregiver advised to call back with any questions or worsening symptoms

## 2022-01-15 ENCOUNTER — Ambulatory Visit
Payer: PRIVATE HEALTH INSURANCE | Attending: Rehabilitative and Restorative Service Providers" | Primary: Student in an Organized Health Care Education/Training Program

## 2022-01-16 ENCOUNTER — Ambulatory Visit
Admit: 2022-01-16 | Payer: PRIVATE HEALTH INSURANCE | Primary: Student in an Organized Health Care Education/Training Program

## 2022-01-16 ENCOUNTER — Ambulatory Visit
Admit: 2022-01-16 | Discharge: 2022-01-22 | Payer: PRIVATE HEALTH INSURANCE | Attending: Student in an Organized Health Care Education/Training Program | Primary: Student in an Organized Health Care Education/Training Program

## 2022-01-16 DIAGNOSIS — N950 Postmenopausal bleeding: Secondary | ICD-10-CM

## 2022-01-16 LAB — CBC
Hematocrit: 37.8 % (ref 35.0–45.0)
Hemoglobin: 13 g/dL (ref 11.7–15.5)
MCH: 31.8 pg (ref 27.0–33.0)
MCHC: 34.3 g/dL (ref 32.0–36.0)
MCV: 92.7 fL (ref 80.0–100.0)
MPV: 8.2 fL (ref 7.5–11.5)
Platelets: 350 10*3/uL (ref 140–400)
RBC: 4.07 10*6/uL (ref 3.80–5.10)
RDW: 14.8 % (ref 11.0–15.0)
WBC: 6.9 10*3/uL (ref 3.8–10.8)

## 2022-01-16 NOTE — Unmapped (Signed)
Attending Addendum:    I discussed with the resident and agree with his/her findings and plan as documented in the resident's note.  Patient was not seen or examined by the Attending Physician.     HPI:    Patient is a 51 y.o. female with PMH significant for HTN vaginal bleeding, previous colposcopy.    Physical Exam:   Vital signs: BP 141/88 (BP Location: Right arm, Patient Position: Sitting, BP Cuff Size: Extra Large)    Pulse 67    Ht 5' 2.01 (1.575 m)    Wt (!) 228 lb (103.4 kg)    BMI 41.69 kg/m??   Physical exam per resident note.      Assessment/Plan:  No diagnosis found.    Vag exam here. Orthostatics.  Agree with plan.    Rest of plan per resident note.    Maralyn Sago, MD

## 2022-01-16 NOTE — Unmapped (Signed)
[facility] - INTERNAL MEDICINE RESIDENT PRACTICE  Phone: [phone]    Name:  Alexandra [name]  Date of Birth: 15-Feb-1971   MRN: [id number]    Date of Service:  01/16/22    History of Present Illness:   Alexandra [name] is a 51 y.o. female with history of HTN, bilateral shoulder pain, BMI 42 here for Vaginal Bleeding (Heavy bleeding started 12/16/2021 3 days after Transvaginal ultrasound without abdominal pain.)    Last seen: 09/19/2021 Alexandra Canterbury, [name] for gynecologic exam    #Vaginal Bleeding: Pt had not had a period for 2 years- was determined to have gone through menopause. Pt had a pap smear with Alexandra Batten on 09/20/2021 which revealed ASCUS, HPV -, trich +. Pt received treatment for trich. Had colposcopy with biopsy 11/15/2021 without dysplasia. Pt had TVUS on 2/10 for postmenopausal bleeding- has been bleeding consistently since. TVUS showed normal endometrial thickness; 1.9 cm intramural uterine fibroid. Pt has been going through about 3 menstrual pads a day- states that every now and then, the bleeding will lighten up and then get heavy again. Bleeding slows at night. Pt states she has been bleeding daily since February 10th. Denies cramping, abdominal pain, hot flashes, flushing, breast tenderness. Denies pain with urination/ defecation or pain with intercourse. Pt had a tubal ligation. Not taking any hormonal supplements. Denies dizziness or lightheadedness.     Health Maintenance Due   Topic Date Due   ??? Colorectal Cancer Screening (MyChart)  Never done   ??? Immunization: COVID-19 (2 - Pfizer series) 03/10/2020   ??? Mammogram (MyChart)  Never done   ??? Immunization: Zoster (1 of 2) Never done   ??? Comprehensive Physical Exam  10/03/2021      Objective:   BP 141/88 (BP Location: Right arm, Patient Position: Sitting, BP Cuff Size: Extra Large)    Pulse 67    Ht 5' 2.01 (1.575 m)    Wt (!) 228 lb (103.4 kg)    BMI 41.69 kg/m??      Physical Exam  Exam conducted with a chaperone present.   Eyes:      General: No  scleral icterus.     Extraocular Movements: Extraocular movements intact.   Cardiovascular:      Rate and Rhythm: Normal rate and regular rhythm.      Heart sounds: No murmur heard.  Pulmonary:      Effort: Pulmonary effort is normal. No respiratory distress.      Breath sounds: No wheezing.   Genitourinary:     Vagina: No vaginal discharge.      Comments: Scant red blood from cervical os. No cervical or vaginal lacerations noted.   Musculoskeletal:      Right lower leg: No edema.      Left lower leg: No edema.   Skin:     General: Skin is warm and dry.   Neurological:      Mental Status: She is alert.   Psychiatric:         Mood and Affect: Mood normal.         Behavior: Behavior normal.       Assessment/Plan:     Alexandra [name] was seen today for vaginal bleeding.    Diagnoses and all orders for this visit:    Post-menopausal bleeding (Primary)  Assessment & Plan:  Pt has been bleeding daily since TVUS 12/13/21. Pt bleeding through about 3 menstrual pads a day. Orthostatic vitals negative. Will check CBC. POC pregnancy test  ordered- however patient left clinic prior to providing sample (has tubal ligation). Pt to follow up with gyn-onc urgently as she may require additional gynecologic procedures    Orders:  -     Pregnancy - Urine (POC)  -     CBC; Future       Return in about 4 weeks (around 02/13/2022).

## 2022-01-16 NOTE — Unmapped (Addendum)
It was great meeting you today!     We should get you into the gynecology doctors ASAP to evaluate the menstrual bleeding as there are a couple procedures that they may need to do. I will give them a call to expedite things but you should call: Office: [phone].     If you develop lightheadedness or dizziness, you should go to the emergency department for evaluation.     You should stop by the lab on your way out to get your blood work checked- your blood levels.

## 2022-01-17 NOTE — Unmapped (Signed)
Patient called in to schedule for PMB, patient wants in as soon as possible. Where can I put her?

## 2022-01-17 NOTE — Unmapped (Signed)
Called patient and scheduled appointment for 02/03/22 at 10a

## 2022-01-20 NOTE — Unmapped (Signed)
Pt has been bleeding daily since TVUS 12/13/21. Pt bleeding through about 3 menstrual pads a day. Orthostatic vitals negative. Will check CBC. POC pregnancy test ordered- however patient left clinic prior to providing sample (has tubal ligation). Pt to follow up with gyn-onc urgently as she may require additional gynecologic procedures

## 2022-02-03 ENCOUNTER — Ambulatory Visit
Payer: PRIVATE HEALTH INSURANCE | Attending: Gynecologic Oncology | Primary: Student in an Organized Health Care Education/Training Program

## 2022-02-03 NOTE — Unmapped (Signed)
Patient called and rescheduled NPV to 02/24/22. Patient said she wouldn't be able to make it.

## 2022-02-05 ENCOUNTER — Ambulatory Visit
Admit: 2022-02-05 | Discharge: 2022-02-05 | Payer: PRIVATE HEALTH INSURANCE | Attending: Rehabilitative and Restorative Service Providers" | Primary: Student in an Organized Health Care Education/Training Program

## 2022-02-05 DIAGNOSIS — Z9889 Other specified postprocedural states: Secondary | ICD-10-CM

## 2022-02-05 NOTE — Unmapped (Signed)
[facility]  [name]    ORTHOPEDIC SURGERY AND SPORTS MEDICINE    Alexandra [name]   12/15/1970     Date of Exam:   02/05/2022    Date of Surgery:  07/25/2021  Surgical Procedure:  right shoulder arthroscopy with rotator cuff repair and subacromial decompression   Findings:   1. Superior Labrum/Biceps Tendon- The biceps tendon and superior labrum were intact.    2. Anterior/Posterior Labrum- The anterior and posterior labrum were intact without fraying.  3. Capsule: normal  4. Rotator Cuff- near full thickness articular tear of anterior supraspinatus  5. Inferior Axillary Recess- No loose bodies  6. Articular Surfaces- Intact with Grade 1 changes to the humeral head  7. Difffuse synovitis - absent    Alexandra [name] returns today for the 6+ month post-operative exam. Patient had been struggling with some pain and stiffness post operative. Last visit we did intraarticular steroid injection. She has noticed a great deal of relief with this. Motion has been improving. Still working on strength. Has continued PT at [facility]. Denies numbness or tingling distally.     Exam:  The right shoulder is examined.  The patient???s incisions are healing well without signs of infection.  There is no erythema or drainage.  There is no swelling.  Range of motion is to 150?? of forward flexion and 80?? of external rotation. Internal rotation to T10. 4/5 strength with external rotation and abduction in scapular plane. The arm is grossly neurovascularly intact distally.    Assesment: Status post right shoulder arthroscopy with rotator cuff repair and subacromial decompression     Plan:  Patient is doing good at this point. Continue with PT working on further motion and strength. As she improves she can wean back on formal therapy to home exercise program. Follow up 2-3 months to check on progression. Everything was explained in detail to the patient voiced understanding and agreement with the plan.    Arman Bogus, PA

## 2022-02-17 MED ORDER — ibuprofen (MOTRIN) 600 MG tablet
600 | ORAL_TABLET | Freq: Three times a day (TID) | ORAL | 1 refills | Status: AC
Start: 2022-02-17 — End: ?

## 2022-02-18 MED ORDER — hydroCHLOROthiazide (HYDRODIURIL) 25 MG tablet
25 | ORAL_TABLET | Freq: Every day | ORAL | 3 refills | 90.00000 days | Status: AC
Start: 2022-02-18 — End: ?

## 2022-02-24 ENCOUNTER — Ambulatory Visit
Admit: 2022-02-24 | Discharge: 2022-02-24 | Payer: PRIVATE HEALTH INSURANCE | Attending: Gynecologic Oncology | Primary: Student in an Organized Health Care Education/Training Program

## 2022-02-24 DIAGNOSIS — N950 Postmenopausal bleeding: Secondary | ICD-10-CM

## 2022-02-24 LAB — POC HCG QUALITATIVE, URINE: hCG Qualitative -Clinitek: NEGATIVE

## 2022-02-24 NOTE — Unmapped (Signed)
GYNECOLOGIC ONCOLOGY NEW PATIENT CONSULTATION      Date of Service:02/24/2022  Referring Provider: Esther Hardy, MD  Referring Provider Address: Esther Hardy, MD  [address]  [place] [address]  [place],  [place] [address]  Consulting Provider: Adrian Prows, MD       HISTORY OF PRESENT ILLNESS:  Alexandra [name] is a 51 y.o. year old woman who is seen in consultation at the request of Esther Hardy, MD for post-menopausal bleeding. Patient underwent colposcopy back on 11/15/2021 for hx of cervical dysplasia (pap 09/20/2021 with ASCUS, HR HPV negative). She states that since that biopsy she had heavy bleeding every month. Prior to this she had not had bleeding since 2021. She had a TVUS back on 12/13/2021 showing that the uterus is retroflexed and measures 6.3 x 5.1 x 3.4 cm for a total volume of 57 mL. 1.9 cm intramural fibroid in the left mid uterus. The endometrial stripe thickness is normal at 4 mm. No free pelvic fluid is seen. The right ovary measures 2.4 x 1.8 x 1.8 cm, for a total volume of 4.2 mL. Normal in sonographic appearance. The left ovary measures 1.7 x 1.5 x 1 cm, for a total volume of 1.4 mL. Normal in sonographic appearance.     She reports her bleeding has been the same since January. Will have monthly bleeding that is heavy at first and can last 2-3 weeks and then will have a 1 week break. She denies dizziness/lightheadedness, sob, chest pain.     Patient Active Problem List    Diagnosis    ??? Post-menopausal bleeding      Note Last Updated: 01/28/2022     PMB  Referral from Dr. Genevie Cheshire    06/14/2020 PAP: NILM    09/20/2021 PAP: ASCUS, HR HPV negative    Complaints of PMB at PCP visit     11/15/2021 COLPO: A. Cervix, biopsies at 03:30: Ectocervical mucosa. No transition zone. No HPV or dysplasia. B. Cervix biopsies, 6 o'clock: Fragment of transition zone with HPV change. No dysplasia. C. ECC: Fragments of benign endocervical epithelium. Scant ectocervical epithelium with HPV  change. No dysplasia.     12/13/2021 TVUS: FINDINGS: The uterus is retroflexed and measures 6.3 x 5.1 x 3.4 cm for a total volume of 57 mL. 1.9 cm intramural fibroid in the left mid uterus. The endometrial stripe thickness is normal at 4 mm. No free pelvic fluid is seen. The right ovary measures 2.4 x 1.8 x 1.8 cm, for a total volume of 4.2 mL. Normal in sonographic appearance. The left ovary measures 1.7 x 1.5 x 1 cm, for a total volume of 1.4 mL. Normal in sonographic appearance. IMPRESSION: Normal endometrial thickness. 1.9 cm intramural uterine fibroid    Disposition:     ??? S/P arthroscopy of right shoulder    ??? Body mass index (BMI) 40.0-44.9, adult (CMS-HCC)    ??? Dyspnea on exertion    ??? Nontraumatic incomplete tear of right rotator cuff    ??? Chronic pain of both knees    ??? Morbid obesity (CMS-HCC)    ??? Primary hypertension    ??? Chronic pain of both shoulders    ??? Healthcare maintenance    ??? 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 Menarche  Age 74   ??? STD (sexually transmitted disease)     history of GC/trichomonas as teen       PAST SURGICAL HISTORY:  Past Surgical History:   Procedure Laterality Date   ??? CESAREAN SECTION  11/03/1989   ??? CYST REMOVAL     ??? SHOULDER ARTHROSCOPY Right 07/25/2021    Procedure: RIGHT ARTHROSCOPY SHOULDER , SUBACROMIAL DECOMPRESSION , ROTATOR CUFF REPAIR;  Surgeon: Gayla Medicus, MD;  Location: [facility];  Service: Orthopedics;  Laterality: Right;   ??? TUBAL LIGATION  11/03/1994       OB/GYN HISTORY:  G3P3  Age at menarche: unknown  Age at menopause: 2021-48  Hx of HRT: no  Hx of STDs: hx of trich 09/20/21  Last pap: 09/20/21: ASCUS, HPV neg, s/p colpo 11/2021 negative  History of abnormal pap smears: yes see above    SCREENING STUDIES:   Last mammogram: no, due for one  Last colonoscopy: due      MEDICATIONS:    Current Outpatient Medications:   ???   acetaminophen (TYLENOL EXTRA STRENGTH) 500 MG tablet, Take 2 tablets (1,000 mg total) by mouth every 8 hours as needed., Disp: 90 tablet, Rfl: 1  ???  amlodipine-olmesartan (AZOR) 5-20 mg per tablet, Take 1 tablet by mouth daily., Disp: 90 tablet, Rfl: 1  ???  hydroCHLOROthiazide (HYDRODIURIL) 25 MG tablet, Take 1 tablet (25 mg total) by mouth daily., Disp: 90 tablet, Rfl: 3  ???  ibuprofen (MOTRIN) 600 MG tablet, Take 1 tablet (600 mg total) by mouth every 8 hours., Disp: 60 tablet, Rfl: 1      ALLERGIES:  No Known Allergies      FAMILY HISTORY:  Family History   Problem Relation Age of Onset   ??? Diabetes Father         NIDDM   ??? Diabetes Paternal Grandmother         IDDM   ??? Breast Cancer Neg Hx    ??? Colon Cancer Neg Hx    ??? Ovarian cancer Neg Hx          SOCIAL HISTORY:  Social History     Socioeconomic History   ??? Marital status: Single     Spouse name: Not on file   ??? Number of children: Not on file   ??? Years of education: Not on file   ??? Highest education level: Not on file   Occupational History   ??? Not on file   Tobacco Use   ??? Smoking status: Never   ??? Smokeless tobacco: Never   Vaping Use   ??? Vaping Use: Never used   Substance and Sexual Activity   ??? Alcohol use: No   ??? Drug use: Yes     Types: Marijuana   ??? Sexual activity: Yes     Partners: Male     Birth control/protection: None   Other Topics Concern   ??? Caffeine Use Yes     Comment: 2L every other day   ??? Occupational Exposure No   ??? Exercise No   ??? Seat Belt Yes   Social History Narrative   ??? Not on file     Social Determinants of Health     Financial Resource Strain: Not on file   Physical Activity: Not on file   Stress: Not on file   Social Connections: Not on file   Housing Stability: Not on file       REVIEW OF SYSTEMS:  Complete 10-system review was performed  and is negative except for the following: see HPI      PHYSICAL EXAM:     Vitals:    02/24/22 1425   BP: (!) 165/109   Pulse: 102   Resp: 16   Temp: 98.4 ??F (36.9 ??C)   SpO2: 100%     General: No  acute distress.  HEENT: Normocephalic, atraumatic.  Sclera anicteric, posterior oropharynx clear.  Normal dentition.  Chest: Clear to auscultation bilaterally.  Cardiovascular: Regular rate and rhythm, no murmurs, rubs, or gallops.  Breasts: Deferred  Abdomen: Soft, nondistended, nontender to palpation.  No masses or hepatosplenomegaly appreciated.  No evidence of hernia.  No palpable fluid wave.    Extremities: Grossly normal range of motion.  Warm, well perfused.  No edema bilaterally.  Skin: No rashes or lesions.  Neuro: Alert and oriented.  Normal gross range of motion and sensation  Lymphatics: No cervical, supraclavicular, or inguinal adenopathy.  GU: External genitalia without lesions.  On speculum exam, normal appearing cervix.  Bimanual exam reveals no masses or lesions..    Endometrial Biopsy Procedure Note    Indications: postmenopausal bleeding    Procedure Details   Urine pregnancy test was done and result was negative.  The risks (including infection, bleeding, pain, inadequate tissue, uterine perforation, and missed cancer diagnosis) and expectations of the procedure were explained to the patient.  Questions were answered.  Informed consent was obtained.      The patient was placed in the dorsal lithotomy position. A speculum was inserted in the vagina, and the cervix prepped with povidone iodine. A sharp tenaculum was applied to the anterior lip of the cervix for stabilization. The uterus was sounded to a depth of 7cm.  A Pipelle endometrial aspirator was used to sample the endometrium in 3 passes.  Moderate tissue was obtained.  Sample was sent for pathologic examination. Good hemostasis following.       Condition:  Stable    Complications:  None      LABORATORY AND RADIOLOGIC DATA:  Lab Frequency Next Occurrence         ASSESSMENT AND PLAN:  LYDIAN CHAVOUS is a 51 y.o. woman with post-menopausal bleeding.    1. Post-menopausal bleeding  Alexandra [name] is a 51 y.o. year old woman who is seen in  consultation at the request of Esther Hardy, MD for post-menopausal bleeding. Patient underwent colposcopy back on 11/15/2021 for hx of cervical dysplasia (pap 09/20/2021 with ASCUS, HR HPV negative). She states that since that biopsy she had heavy bleeding every month. Prior to this she had not had bleeding since 2021. She had a TVUS back on 12/13/2021 showing that the uterus is retroflexed and measures 6.3 x 5.1 x 3.4 cm for a total volume of 57 mL. 1.9 cm intramural fibroid in the left mid uterus. The endometrial stripe thickness is normal at 4 mm. No free pelvic fluid is seen. The right ovary measures 2.4 x 1.8 x 1.8 cm, for a total volume of 4.2 mL. Normal in sonographic appearance. The left ovary measures 1.7 x 1.5 x 1 cm, for a total volume of 1.4 mL. Normal in sonographic appearance.   -Seen 02/24/22 for initial consult. EMB performed. Will RTC in 2-3 weeks for EMB results     2. Cervical dysplasia & Abnl Paps  08/2013: ASCUS, HPV HR Other +   12/2013: Colpo with CIN I   06/14/2020: NILM   09/20/2021: ASCUS, HPV-, Trich +   S/p colposcopy 11/15/2021:  bx taken at 3 and 6 o'clock: negative for dysplasia, negative ECC  -plan for repeat co-testing in 1 year    3. Elevated BMI/obesity  -BMI 41.9 02/24/22    4. HTN:  -02/24/22 elevated, on HCTZ and amlodipine-olmesartan    5. H/o right ovarian cystectomy    6. H/o bilateral tubal ligation    7. Social:  -Home Health Aide  [phone]: s/p x2 SVD, x1 CS    Dispo: return to care in 2-3 weeks    ATTESTATION  I saw and personally examined the patient today with my resident Dr. Sheran Lawless MD. I discussed the findings and therapeutic plan with the patient. I repeated, reviewed and agree with the history of present illness, past medical histories, family history, social history, medication list, and allergies as listed. The review of systems is as noted above. My physical exam confirms the findings listed above. Review of labs, pathology reports, radiograph reports, and  medical records confirm the findings noted above. I agree with the assessment and plan as noted above. I have edited the note where appropriate.    I have personally performed a face to face diagnostic evaluation on this patient. I have personally developed the care plan as outlined in the Assessment and Plan.     Complexity: High    I spent a total of 45 minutes in prepping for her visit including review of her chart and any new records, pertinent images and laboratory tests etc, interviewing patient for any new or continuing problems, performing her physical exam, and counseling and/or coordination of care as well as counseling.with patient and/or family.     Topics discussed included her PMB, cervical dysplasia and abnormal Pap, and increased BMI treatment course to date, her prognosis, her recommended management, and surveillance plan.    Esther Hardy, MD

## 2022-03-17 ENCOUNTER — Ambulatory Visit: Admit: 2022-03-17 | Discharge: 2022-03-17 | Payer: PRIVATE HEALTH INSURANCE | Attending: Gynecologic Oncology

## 2022-03-17 DIAGNOSIS — N950 Postmenopausal bleeding: Secondary | ICD-10-CM

## 2022-03-17 NOTE — Unmapped (Signed)
GYNECOLOGIC ONCOLOGY NEW PATIENT CONSULTATION      Date of Service:03/17/2022  Referring Provider: Esther Hardy, MD  Referring Provider Address: Esther Hardy, MD  [address]  Gynecologic Oncology  [place],  [place] [address]  Consulting Provider: Adrian Prows, MD       HISTORY OF PRESENT ILLNESS:  Alexandra [name] is a 51 y.o. year old woman who is seen in consultation at the request of Esther Hardy, MD for post-menopausal bleeding. Patient underwent colposcopy back on 11/15/2021 for hx of cervical dysplasia (pap 09/20/2021 with ASCUS, HR HPV negative). She states that since that biopsy she had heavy bleeding every month. Prior to this she had not had bleeding since 2021. She had a TVUS back on 12/13/2021 showing that the uterus is retroflexed and measures 6.3 x 5.1 x 3.4 cm for a total volume of 57 mL. 1.9 cm intramural fibroid in the left mid uterus. The endometrial stripe thickness is normal at 4 mm. No free pelvic fluid is seen. The right ovary measures 2.4 x 1.8 x 1.8 cm, for a total volume of 4.2 mL. Normal in sonographic appearance. The left ovary measures 1.7 x 1.5 x 1 cm, for a total volume of 1.4 mL. Normal in sonographic appearance.     She reports her bleeding has been the same since January. Will have monthly bleeding that is heavy at first and can last 2-3 weeks and then will have a 1 week break. She denies dizziness/lightheadedness, sob, chest pain.     Patient seen today for follow up and notes has not had bleeding since last appointment. She overall feels well. Denies abdominal pain, weight change, vaginal discharge, or issues with bowel/bladder habits.     Patient Active Problem List    Diagnosis    ??? Post-menopausal bleeding      Note Last Updated: 03/17/2022     PMB  Referral from Dr. Genevie Cheshire    06/14/2020 PAP: NILM    09/20/2021 PAP: ASCUS, HR HPV negative    Complaints of PMB at PCP visit     11/15/2021 COLPO: A. Cervix, biopsies at 03:30: Ectocervical mucosa. No transition  zone. No HPV or dysplasia. B. Cervix biopsies, 6 o'clock: Fragment of transition zone with HPV change. No dysplasia. C. ECC: Fragments of benign endocervical epithelium. Scant ectocervical epithelium with HPV change. No dysplasia.     12/13/2021 TVUS: FINDINGS: The uterus is retroflexed and measures 6.3 x 5.1 x 3.4 cm for a total volume of 57 mL. 1.9 cm intramural fibroid in the left mid uterus. The endometrial stripe thickness is normal at 4 mm. No free pelvic fluid is seen. The right ovary measures 2.4 x 1.8 x 1.8 cm, for a total volume of 4.2 mL. Normal in sonographic appearance. The left ovary measures 1.7 x 1.5 x 1 cm, for a total volume of 1.4 mL. Normal in sonographic appearance. IMPRESSION: Normal endometrial thickness. 1.9 cm intramural uterine fibroid    02/24/2022 EMB: Endometrium, biopsy:  Proliferative endometrium with glandular and stromal breakdown, hemorrhage, and fibrin thrombi, suggestive of persistent estrogen effect. No evidence of atypia, hyperplasia, EIN, or malignancy.     Disposition: RTC 2-3 weeks     ??? S/P arthroscopy of right shoulder    ??? Body mass index (BMI) 40.0-44.9, adult (CMS-HCC)    ??? Dyspnea on exertion    ??? Nontraumatic incomplete tear of right rotator cuff    ??? Chronic pain of both knees    ??? Morbid  obesity (CMS-HCC)    ??? Primary hypertension    ??? Chronic pain of both shoulders    ??? Healthcare maintenance    ??? 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 Hypertension    ??? Menarche     Age 17   ??? STD (sexually transmitted disease)     history of GC/trichomonas as teen       PAST SURGICAL HISTORY:  Past Surgical History:   Procedure Laterality Date   ??? CESAREAN SECTION  11/03/1989   ??? CYST REMOVAL     ??? SHOULDER ARTHROSCOPY Right 07/25/2021    Procedure: RIGHT ARTHROSCOPY SHOULDER , SUBACROMIAL DECOMPRESSION , ROTATOR CUFF REPAIR;  Surgeon: Gayla Medicus, MD;   Location: [facility];  Service: Orthopedics;  Laterality: Right;   ??? TUBAL LIGATION  11/03/1994       OB/GYN HISTORY:  G3P3  Age at menarche: unknown  Age at menopause: 2021-48  Hx of HRT: no  Hx of STDs: hx of trich 09/20/21  Last pap: 09/20/21: ASCUS, HPV neg, s/p colpo 11/2021 negative  History of abnormal pap smears: yes see above    SCREENING STUDIES:   Last mammogram: no, due for one  Last colonoscopy: due      MEDICATIONS:    Current Outpatient Medications:   ???  acetaminophen (TYLENOL EXTRA STRENGTH) 500 MG tablet, Take 2 tablets (1,000 mg total) by mouth every 8 hours as needed., Disp: 90 tablet, Rfl: 1  ???  amlodipine-olmesartan (AZOR) 5-20 mg per tablet, Take 1 tablet by mouth daily., Disp: 90 tablet, Rfl: 1  ???  hydroCHLOROthiazide (HYDRODIURIL) 25 MG tablet, Take 1 tablet (25 mg total) by mouth daily., Disp: 90 tablet, Rfl: 3  ???  ibuprofen (MOTRIN) 600 MG tablet, Take 1 tablet (600 mg total) by mouth every 8 hours., Disp: 60 tablet, Rfl: 1      ALLERGIES:  No Known Allergies      FAMILY HISTORY:  Family History   Problem Relation Age of Onset   ??? Diabetes Father         NIDDM   ??? Diabetes Paternal Grandmother         IDDM   ??? Breast Cancer Neg Hx    ??? Colon Cancer Neg Hx    ??? Ovarian cancer Neg Hx          SOCIAL HISTORY:  Social History     Socioeconomic History   ??? Marital status: Single     Spouse name: Not on file   ??? Number of children: Not on file   ??? Years of education: Not on file   ??? Highest education level: Not on file   Occupational History   ??? Not on file   Tobacco Use   ??? Smoking status: Never   ??? Smokeless tobacco: Never   Vaping Use   ??? Vaping Use: Never used   Substance and Sexual Activity   ??? Alcohol use: No   ??? Drug use: Not Currently     Types: Marijuana   ??? Sexual activity: Yes     Partners: Male     Birth control/protection: None   Other Topics Concern   ??? Caffeine Use Yes     Comment: 2L every other day   ??? Occupational Exposure No   ??? Exercise  No   ??? Seat Belt Yes   Social History  Narrative   ??? Not on file     Social Determinants of Health     Financial Resource Strain: Not on file   Physical Activity: Not on file   Stress: Not on file   Social Connections: Not on file   Housing Stability: Not on file       REVIEW OF SYSTEMS:  Complete 10-system review was performed and is negative except for the following: see HPI      PHYSICAL EXAM:     Vitals:    03/17/22 1124   BP: (!) 135/98   Pulse: 73   Resp: 16   Temp: 96.9 ??F (36.1 ??C)   SpO2: 97%     General: No acute distress.  HEENT: Normocephalic, atraumatic.  Sclera anicteric, posterior oropharynx clear.  Normal dentition.  Chest: Clear to auscultation bilaterally.  Cardiovascular: Regular rate and rhythm, no murmurs, rubs, or gallops.  Breasts: Deferred  Abdomen: Soft, nondistended, nontender to palpation.  No masses or hepatosplenomegaly appreciated.  No evidence of hernia.  No palpable fluid wave.    Extremities: Grossly normal range of motion.  Warm, well perfused.  No edema bilaterally.  Skin: No rashes or lesions.  Neuro: Alert and oriented.  Normal gross range of motion and sensation  Lymphatics: No cervical, supraclavicular, or inguinal adenopathy.  GU: Declined exam today - previous exam showed: External genitalia without lesions.  On speculum exam, normal appearing cervix.  Bimanual exam reveals no masses or lesions..    Condition:  Stable    Complications:  None      LABORATORY AND RADIOLOGIC DATA:  Lab Frequency Next Occurrence       ASSESSMENT AND PLAN:  TKEYA STENCIL is a 51 y.o. woman with post-menopausal bleeding.    1. Post-menopausal bleeding  Alexandra [name] is a 51 y.o. year old woman who is seen in consultation at the request of Esther Hardy, MD for post-menopausal bleeding. Patient underwent colposcopy back on 11/15/2021 for hx of cervical dysplasia (pap 09/20/2021 with ASCUS, HR HPV negative). She states that since that biopsy she had heavy bleeding every month. Prior to this she had not had bleeding since 2021. She had a  TVUS back on 12/13/2021 showing that the uterus is retroflexed and measures 6.3 x 5.1 x 3.4 cm for a total volume of 57 mL. 1.9 cm intramural fibroid in the left mid uterus. The endometrial stripe thickness is normal at 4 mm. No free pelvic fluid is seen. The right ovary measures 2.4 x 1.8 x 1.8 cm, for a total volume of 4.2 mL. Normal in sonographic appearance. The left ovary measures 1.7 x 1.5 x 1 cm, for a total volume of 1.4 mL. Normal in sonographic appearance.   -Seen 02/24/22 for initial consult. EMB performed. Will RTC in 2-3 weeks for EMB results   -seen 5/15 discussed pathology results with her. Showed proliferative endometrium with glandular and stromal breakdown, hemorrhage, and fibrin thrombi, suggestive of persistent estrogen effect. Plan for RTC in 6 months for follow up.    2. Cervical dysplasia & Abnl Paps  08/2013: ASCUS, HPV HR Other +   12/2013: Colpo with CIN I   06/14/2020: NILM   09/20/2021: ASCUS, HPV-, Trich +   S/p colposcopy 11/15/2021: bx taken at 3 and 6 o'clock: negative for dysplasia, negative ECC  -plan for repeat co-testing in 1 year    3. Elevated BMI/obesity  -BMI 41.9  02/24/22    4. HTN:  -02/24/22 elevated, on HCTZ and amlodipine-olmesartan    5. H/o right ovarian cystectomy    6. H/o bilateral tubal ligation    7. Social:  -Home Health Aide  [phone]: s/p x2 SVD, x1 CS    Dispo: return to clinic in 6 months      The HPI, ROS and test results were reviewed and copied forward (with edits) from a note written by Dr. Maryruth Bun on 02/24/22. I have reviewed and updated the history, physical exam, data, assessment, and plan of the note so that it reflects my evaluation and management of the patient.      Olean Ree, MD PGY-2  Obstetrics and Gynecology    ATTESTATION  I saw and personally examined the patient today with my resident Dr. Olean Ree MD. I discussed the findings and therapeutic plan with the patient. I repeated, reviewed and agree with the history of present illness,  past medical histories, family history, social history, medication list, and allergies as listed. The review of systems is as noted above. My physical exam confirms the findings listed above. Review of labs, pathology reports, radiograph reports, and medical records confirm the findings noted above. I agree with the assessment and plan as noted above. I have edited the note where appropriate.    I have personally performed a face to face diagnostic evaluation on this patient. I have personally developed the care plan as outlined in the Assessment and Plan.     Complexity: High    I spent a total of 30 minutes in prepping for her visit including review of her chart and any new records, pertinent images and laboratory tests etc, interviewing patient for any new or continuing problems, performing her physical exam, and counseling and/or coordination of care as well as counseling.with patient and/or family.     Topics discussed included her PMB, cervical dysplasia and increased BMI treatment course to date, her prognosis, her recommended management, and surveillance plan.    Esther Hardy, MD

## 2022-03-18 MED ORDER — acetaminophen (TYLENOL EXTRA STRENGTH) 500 MG tablet
500 | ORAL_TABLET | Freq: Three times a day (TID) | ORAL | 1 refills | 8.00000 days | Status: AC | PRN
Start: 2022-03-18 — End: ?

## 2022-04-10 ENCOUNTER — Ambulatory Visit: Payer: PRIVATE HEALTH INSURANCE

## 2022-04-14 NOTE — Unmapped (Signed)
Received message from clinical staff that patient needs TB skin test for school. Placed order for this to be administered at nursing visit once scheduled.

## 2022-04-25 ENCOUNTER — Ambulatory Visit: Payer: PRIVATE HEALTH INSURANCE | Attending: Student in an Organized Health Care Education/Training Program

## 2022-04-25 NOTE — Unmapped (Unsigned)
[facility] Internal Medicine - [facility]  [address] [place], [place]  [address]  Phone: [phone]    Date of Service:  04/24/2022    HPI/Assessment/Plan:     Alexandra [name] is a(n) 51 y.o. female hx HTN, bilateral shoulder pain, BMI 42 who presents to [facility] for follow up visit for No chief complaint on file.    Needs TB test for school    There are no diagnoses linked to this encounter.    No follow-ups on file.    --------------------------------------------------------------------------------------------------------------  Chart Prep:  Last OV:  01/16/2022 Genevie Cheshire, MD  Interval Hx:     Gyn onc: Patient underwent colposcopy back on 11/15/2021 for hx of cervical dysplasia (pap 09/20/2021 with ASCUS, HR HPV negative). She states that since that biopsy she had heavy bleeding every month. Prior to this she had not had bleeding since 2021. She had a TVUS back on 12/13/2021 showing that the uterus is retroflexed and measures 6.3 x 5.1 x 3.4 cm for a total volume of 57 mL. 1.9 cm intramural fibroid in the left mid uterus. The endometrial stripe thickness is normal at 4 mm. No free pelvic fluid is seen. The right ovary measures 2.4 x 1.8 x 1.8 cm, for a total volume of 4.2 mL. Normal in sonographic appearance. The left ovary measures 1.7 x 1.5 x 1 cm, for a total volume of 1.4 mL. Normal in sonographic appearance.   -Seen 02/24/22 for initial consult. EMB performed.   -seen 5/15 discussed pathology results with her. Showed proliferative endometrium with glandular and stromal breakdown, hemorrhage, and fibrin thrombi, suggestive of persistent estrogen effect. Plan for RTC in 6 months for follow up.    Physical Exam:   There were no vitals taken for this visit.   GENERAL: Alert and cooperative. No acute distress. ***  EYES: PERRL. EOM intact. No scleral icterus.   ENT: Neck supple. Trachea midline. No obvious masses or deformities. No cervical lymphadenopathy.  HEART: Regular rate and rhythm. S1,  S2 intact. No murmurs, rubs, gallops. No peripheral edema.  LUNGS: Clear to auscultation bilaterally. Normal work of breathing.  ABDOMEN: Soft, non-tender, non-distended. Bowel sounds intact. No rebound or guarding.  MSK: No joint swelling. No muscle tenderness.  SKIN: No rashes. No ecchymoses.  NEURO: Oriented to person, place, and time. Speech normal. No facial asymmetry.  PSYCH: Normal mood. Normal behavior.

## 2022-05-02 ENCOUNTER — Ambulatory Visit: Payer: PRIVATE HEALTH INSURANCE | Attending: Family

## 2022-05-07 ENCOUNTER — Ambulatory Visit: Payer: PRIVATE HEALTH INSURANCE | Attending: Rehabilitative and Restorative Service Providers"

## 2022-06-19 MED ORDER — hydroCHLOROthiazide (HYDRODIURIL) 25 MG tablet
25 | ORAL_TABLET | Freq: Every day | ORAL | 3 refills | 90.00000 days | Status: AC
Start: 2022-06-19 — End: ?

## 2022-06-19 MED ORDER — acetaminophen (TYLENOL EXTRA STRENGTH) 500 MG tablet
500 | ORAL_TABLET | Freq: Three times a day (TID) | ORAL | 1 refills | 8.00000 days | Status: AC | PRN
Start: 2022-06-19 — End: ?

## 2022-06-19 MED ORDER — amlodipine-olmesartan (AZOR) 5-20 mg per tablet
5-20 | ORAL_TABLET | Freq: Every day | ORAL | 1 refills | Status: AC
Start: 2022-06-19 — End: ?

## 2022-06-19 MED ORDER — ibuprofen (MOTRIN) 600 MG tablet
600 | ORAL_TABLET | Freq: Three times a day (TID) | ORAL | 1 refills | Status: AC
Start: 2022-06-19 — End: ?

## 2022-06-30 ENCOUNTER — Ambulatory Visit: Payer: PRIVATE HEALTH INSURANCE | Attending: Family

## 2022-09-15 ENCOUNTER — Ambulatory Visit: Payer: PRIVATE HEALTH INSURANCE | Attending: Gynecologic Oncology

## 2022-09-17 ENCOUNTER — Ambulatory Visit: Payer: PRIVATE HEALTH INSURANCE | Attending: Rehabilitative and Restorative Service Providers"

## 2022-09-29 MED ORDER — ibuprofen (MOTRIN) 600 MG tablet
600 | ORAL_TABLET | Freq: Three times a day (TID) | ORAL | 1 refills
Start: 2022-09-29 — End: 2022-11-08

## 2022-10-01 ENCOUNTER — Ambulatory Visit: Admit: 2022-10-01 | Discharge: 2022-10-01 | Payer: MEDICAID | Attending: Family

## 2022-10-01 ENCOUNTER — Ambulatory Visit: Admit: 2022-10-02 | Payer: PRIVATE HEALTH INSURANCE

## 2022-10-01 DIAGNOSIS — Z113 Encounter for screening for infections with a predominantly sexual mode of transmission: Secondary | ICD-10-CM

## 2022-10-01 LAB — CHLAMYDIA / GONORRHOEAE DNA SWAB
Chlamydia Trachomatis DNA Swab: NEGATIVE
Neisseria gonorrhoeae DNA Swab: NEGATIVE

## 2022-10-01 NOTE — Unmapped (Signed)
You should get a mammogram every other year to screen for breast cancer. Please call [phone] to schedule

## 2022-10-01 NOTE — Unmapped (Unsigned)
Supplies given to patient for self swab. Specimens collected by patient, specimens labeled and placed in outgoing lab bin.

## 2022-10-01 NOTE — Unmapped (Unsigned)
[facility] - INTERNAL MEDICINE RESIDENT PRACTICE  Phone: [phone]    Name:  Alexandra [name]  Date of Birth: Nov 23, 1970(51 y.o.)    Subjective:     Chief Complaint   Patient presents with    Vaginal Discharge     Has been having discharge past 2 weeks, no odor, itchy     She has been having clear vaginal discharge for the past 2 weeks. Wet under ware. No odor. No pain or cramping. No bleeding. Long time partner.     BP Readings from Last 3 Encounters:   10/01/22 146/89   03/17/22 (!) 135/98   02/24/22 (!) 165/109         Review of Systems    Current Outpatient Medications   Medication Sig    acetaminophen Take 2 tablets (1,000 mg total) by mouth every 8 hours as needed.    amlodipine-olmesartan Take 1 tablet by mouth daily.    hydroCHLOROthiazide Take 1 tablet (25 mg total) by mouth daily.    ibuprofen Take 1 tablet (600 mg total) by mouth every 8 hours.     No current facility-administered medications for this visit.     Objective:   BP 146/89   Pulse 88   Ht 5' 2 (1.575 m)   Wt 214 lb (97.1 kg)   LMP 05/03/2021 (Approximate)   BMI 39.14 kg/m     Physical Exam      Assessment/Plan     Problem List Items Addressed This Visit    None      No problem-specific Assessment & Plan notes found for this encounter.      No follow-ups on file.    No LOS data to display    No LOS data to display

## 2022-10-02 MED ORDER — metroNIDAZOLE (FLAGYL) 500 MG tablet
500 | ORAL_TABLET | Freq: Two times a day (BID) | ORAL | 0 refills | 7.00000 days
Start: 2022-10-02 — End: 2022-10-06

## 2022-10-03 ENCOUNTER — Ambulatory Visit: Payer: PRIVATE HEALTH INSURANCE | Attending: Family

## 2022-10-06 MED ORDER — metroNIDAZOLE (METROGEL) 0.75 % gel
0.75 | Freq: Two times a day (BID) | TOPICAL | 0 refills | 30.00000 days | Status: AC
Start: 2022-10-06 — End: ?

## 2022-10-07 ENCOUNTER — Ambulatory Visit: Payer: MEDICAID | Attending: Family

## 2022-10-08 ENCOUNTER — Ambulatory Visit: Payer: PRIVATE HEALTH INSURANCE | Attending: Rehabilitative and Restorative Service Providers"

## 2022-10-15 ENCOUNTER — Ambulatory Visit: Payer: MEDICAID | Attending: Rehabilitative and Restorative Service Providers"

## 2022-11-06 ENCOUNTER — Ambulatory Visit: Payer: MEDICAID

## 2022-11-06 DIAGNOSIS — Z0000 Encounter for general adult medical examination without abnormal findings: Secondary | ICD-10-CM

## 2022-11-06 NOTE — Unmapped (Deleted)
[facility]  Phone: [phone]    Name:  Alexandra [name]  Date of Birth: 08/17/1971   MRN: [id number]    Date of Service:  11/06/22    History of Present Illness:   Alexandra [name] is a 52 y.o. female with history of HTN, shoulder pain, postmenopausal bleeding (following gyn/onc) here for ***    Last seen: 10/01/2022 Selena Batten, [name] for vaginal discharge, was positive for trichomoniasis and BV, treated with metronidazole gel.    Interval history: ***    Health maintenance: ***Needs labs (ordered already), mammogram, flu/COVID shots    Health Maintenance Due   Topic Date Due    Immunization: Hepatitis B (1 of 3 - 3-dose series) Never done    Colorectal Cancer Screening (MyChart)  Never done    Mammogram (MyChart)  Never done    Immunization: Zoster (1 of 2) Never done    Comprehensive Physical Exam  10/03/2021    Diabetes Screening  05/10/2022    Renal Function/GFR  06/27/2022    Immunization: COVID-19 (2 - 2023-24 season) 07/04/2022    Immunization: Influenza (MyChart) (1) Never done        Objective:   LMP 05/03/2021 (Approximate)      Gen: Sitting up in chair in NAD.  CV: RRR, no m/r/g  Resp: CTAB, no w/r/r, normal WOB  Abd: Soft, nt, nd, +bs  Ext: No LE edema    Recent labs: + BV and trichomonas 10/01/22, neg chlamydia and gonorrhea    Recent imaging: none    Assessment/Plan:     There are no diagnoses linked to this encounter.     Items for Attention on Follow-Up:  - ***  No follow-ups on file.    {Please refresh your note.}      Tretha Sciara, MD  Internal Medicine PGY-2  8:29 PM, 11/05/2022

## 2022-11-06 NOTE — Unmapped (Incomplete)
Thank you for coming in today!    Plan for this visit:  - ***    Future Appointments   Date Time Provider [place]   11/06/2022 10:00 AM Clarice Pole, MD [facility]       Please call the office with any questions you may have.    Follow up in ***    If you do not know your schedule that far in advance, PLEASE schedule a TENTATIVE appointment date and you can always call to change or cancel your appointment date/time by calling the clinic at [phone].

## 2022-11-10 MED ORDER — hydroCHLOROthiazide (HYDRODIURIL) 25 MG tablet
25 | ORAL_TABLET | Freq: Every day | ORAL | 2 refills | 90.00000 days | Status: AC
Start: 2022-11-10 — End: ?

## 2022-11-10 MED ORDER — ibuprofen (MOTRIN) 600 MG tablet
600 | ORAL_TABLET | Freq: Three times a day (TID) | ORAL | 1 refills | Status: AC
Start: 2022-11-10 — End: ?

## 2022-11-10 MED ORDER — amlodipine-olmesartan (AZOR) 5-20 mg per tablet
5-20 | ORAL_TABLET | Freq: Every day | ORAL | 2 refills | Status: AC
Start: 2022-11-10 — End: ?

## 2022-11-28 NOTE — Unmapped (Signed)
Called patient and informed her about medication also scheduled follow up appointment with provider.

## 2022-12-04 ENCOUNTER — Ambulatory Visit

## 2022-12-04 DIAGNOSIS — Z0000 Encounter for general adult medical examination without abnormal findings: Secondary | ICD-10-CM

## 2022-12-04 NOTE — Unmapped (Unsigned)
[facility] - INTERNAL MEDICINE RESIDENT PRACTICE  Phone: [phone]    Name:  Alexandra [name]  Date of Birth: April 16, 1971   MRN: [id number]    Date of Service:  12/04/22    History of Present Illness:   Alexandra [name] is a 52 y.o. female with history of HTN, shoulder pain, postmenopausal bleeding (following gyn/onc) here for ***    Last seen: 10/01/2022 Alexandra Canterbury, [name] for vaginal discharge, was positive for trichomoniasis and BV, treated with metronidazole gel.    Interval history: ***    Health maintenance: ***Needs labs (ordered already), mammogram, colonoscopy, flu/COVID shots    Health Maintenance Due   Topic Date Due    Immunization: Hepatitis B (1 of 3 - 3-dose series) Never done    Colorectal Cancer Screening (MyChart)  Never done    Mammogram (MyChart)  Never done    Immunization: Zoster (1 of 2) Never done    Comprehensive Physical Exam  10/03/2021    Diabetes Screening  05/10/2022    Renal Function/GFR  06/27/2022    Immunization: COVID-19 (2 - 2023-24 season) 07/04/2022    Immunization: Influenza (MyChart) (1) Never done        Objective:   LMP 05/03/2021 (Approximate)      Gen: Sitting up in chair in NAD.  CV: RRR, no m/r/g  Resp: CTAB, no w/r/r, normal WOB  Abd: Soft, nt, nd, +bs  Ext: No LE edema    Recent labs: + BV and trichomonas 10/01/22, neg chlamydia and gonorrhea    Recent imaging: none    Assessment/Plan:     There are no diagnoses linked to this encounter.     Items for Attention on Follow-Up:  - ***  No follow-ups on file.    {Please refresh your note.}      Tyrone Apple, MD  Internal Medicine PGY-2  7:27 PM, 12/03/2022

## 2022-12-04 NOTE — Unmapped (Incomplete)
Thank you for coming in today!    Plan for this visit:  - ***  - Please go across the hallway on this floor to get labs. If you are not sure where to go, ask at checkout. I will {resultsplan:32143}  - Please call [phone]) to make an appointment for a mammogram (breast cancer screening).  - Please call [phone] to schedule a colonoscopy (colon cancer screening).       Please call the office with any questions you may have.    Follow up in {followuptime:32145}    If you do not know your schedule that far in advance, PLEASE schedule a TENTATIVE appointment date and you can always call to change or cancel your appointment date/time by calling the clinic at [phone].

## 2022-12-15 ENCOUNTER — Ambulatory Visit

## 2022-12-15 NOTE — Progress Notes (Deleted)
[facility]  Phone: [phone]    Name:  Alexandra [name]  Date of Birth: 1971/08/12   MRN: [id number]    Date of Service:  12/15/22    History of Present Illness:   Alexandra [name] is a 52 y.o. female with history of HTN, shoulder pain, postmenopausal bleeding (following gyn/onc) here for ***    Last seen: 10/01/2022 Selena Batten, [name] for vaginal discharge, was positive for trichomoniasis and BV, treated with metronidazole gel.    Interval history: ***    Health maintenance: ***Needs labs (ordered already), mammogram, colonoscopy, flu/COVID shots    Health Maintenance Due   Topic Date Due    Immunization: Hepatitis B (1 of 3 - 3-dose series) Never done    Colorectal Cancer Screening (MyChart)  Never done    Mammogram (MyChart)  Never done    Immunization: Zoster (1 of 2) Never done    Comprehensive Physical Exam  10/03/2021    Diabetes Screening  05/10/2022    Renal Function/GFR  06/27/2022    Immunization: COVID-19 (2 - 2023-24 season) 07/04/2022    Immunization: Influenza (MyChart) (1) Never done        Objective:   LMP 05/03/2021 (Approximate)      Gen: Sitting up in chair in NAD.  CV: RRR, no m/r/g  Resp: CTAB, no w/r/r, normal WOB  Abd: Soft, nt, nd, +bs  Ext: No LE edema    Recent labs: + BV and trichomonas 10/01/22, neg chlamydia and gonorrhea    Recent imaging: none    Assessment/Plan:     There are no diagnoses linked to this encounter.     Items for Attention on Follow-Up:  - ***  No follow-ups on file.    {Please refresh your note.}      Tretha Sciara, MD  Internal Medicine PGY-2  8:33 PM, 12/14/2022

## 2022-12-15 NOTE — Patient Instructions (Incomplete)
Thank you for coming in today!    Plan for this visit:  - ***    Please call the office with any questions you may have.    Follow up in {[email or web address]:[id number]}    If you do not know your schedule that far in advance, PLEASE schedule a TENTATIVE appointment date and you can always call to change or cancel your appointment date/time by calling the clinic at [phone].

## 2023-04-22 ENCOUNTER — Ambulatory Visit: Attending: Family

## 2023-04-27 ENCOUNTER — Ambulatory Visit: Admit: 2023-04-27 | Payer: Medicaid (Managed Care)

## 2023-04-27 ENCOUNTER — Ambulatory Visit: Admit: 2023-04-27 | Discharge: 2023-04-27 | Payer: Medicaid (Managed Care)

## 2023-04-27 DIAGNOSIS — I10 Essential (primary) hypertension: Secondary | ICD-10-CM

## 2023-04-27 LAB — URINALYSIS-MACROSCOPIC W/REFLEX TO MICROSCOPIC
Bilirubin, UA: NEGATIVE
Blood, UA: NEGATIVE
Glucose, UA: NEGATIVE mg/dL
Ketones, UA: NEGATIVE mg/dL
Leukocyte Esterase, UA: NEGATIVE
Nitrite, UA: NEGATIVE
Specific Gravity, UA: 1.026 (ref 1.005–1.035)
Urobilinogen, UA: <2 mg/dL (ref 0.2–1.9)
pH, UA: 6 (ref 5.0–8.0)

## 2023-04-27 LAB — URINALYSIS, MICROSCOPIC
RBC, UA: 3 /HPF (ref 0–3)
Squam Epithel, UA: 4 /HPF (ref 0–5)
WBC, UA: 2 /HPF (ref 0–5)

## 2023-04-27 MED ORDER — amLODIPine (NORVASC) 10 MG tablet
10 | ORAL_TABLET | Freq: Every day | ORAL | 0 refills
Start: 2023-04-27 — End: 2023-05-28

## 2023-04-27 NOTE — Progress Notes (Signed)
I did not see or examine the patient. I discussed with the resident and agree with Alexandra Shin, DO's findings and plan as documented in the resident's note.    Returns re Htn-last visit was in 2023. Ran out of meds in January 2024-used to take HCTZ, Amlodipine 5/Olmesartan 20 combo-has no symptoms.  I advised EKG today, ua, renal, home BP cuff.  Resume Hctz, Amlodipine 10-await renal re ARB    Pt last saw gyn onc 03/2022-due for follow up for abnl bleeding, proliferative endometrium on biopsy, abnl paps.  S/p tubal ligation    BP Readings from Last 3 Encounters:   04/27/23 (!) 182/118   10/01/22 146/89   03/17/22 (!) 135/98    Needs close follow up.    Alexandra Studer Nance Pear, MD

## 2023-04-27 NOTE — Addendum Note (Signed)
Addended by: Tora Perches on: 04/27/2023 04:49 PM     Modules accepted: Level of Service

## 2023-04-27 NOTE — Progress Notes (Signed)
[facility] - INTERNAL MEDICINE RESIDENT PRACTICE  Phone: [phone]    Name:  Alexandra [name]  Date of Birth: 13-Jan-1971   MRN: [id number]    Date of Service:  04/27/23    History of Present Illness:   Alexandra [name] is a 52 y.o. female with history of HTN and post-menopausal bleeding here for Hypertension      Last seen: 10/01/2022 Alexandra Canterbury, [name] for vaginal discharge  Interval history:     Pt comes in today for a follow up on her blood pressure. Pt states she needs more blood pressure medicines because she feels her pressure is uncontrolled. She last took her medicines in January of 2024 (6 months ago). She denies headache, lightheadedness, dizziness, chest pain, trouble breathing, abd pain, nausea, vomiting diarrhea, pain with urination, or decreased urination, or leg swelling.       Objective:   Vitals: BP (!) 182/118 (BP Location: Left upper arm, Patient Position: Sitting, BP Cuff Size: Large)   Pulse 65   Temp 98.7 F (37.1 C) (Oral)   Resp 20   Ht 5' 2 (1.575 m)   Wt 196 lb 4.8 oz (89 kg)   LMP 05/03/2021 (Approximate)   SpO2 100%   BMI 35.90 kg/m    General: Pt is alert. Calm and Cooperative. No acute distress. Non-ill appearing.   Head: Atraumatic. Normocephalic  Eyes: Conjunctiva clear. No scleral icterus. No drainage noted.   Ears: External ears normal. No drainage noted.   Nose: Nares normal. No drainage or blood noted.   Mouth/Pharynx: Mucous membranes moist.   Neck: Supple.   Cardiovascular: Heart regular rate and rhythm. No murmurs, rubs or gallops. Pulses equal and symmetric.   Respiratory: Breath sounds clear to auscultation. No wheezes, rales or rhonchi. Normal effort.   Gastrointestinal: Abdomen soft, non-distended, non-tender. No guarding or rebound tenderness.   MSK/Extremities: No deformities. No bony tenderness.   Neuro: Pt is alert and orientated to person, place and time. Able to move extremities. Sensation intact.   Skin: Warm and dry. No visible rashes.        Assessment/Plan:     Alexandra [name] was seen today for hypertension.    Diagnoses and all orders for this visit:    Primary hypertension (Primary)  Pt presents for a follow up on her blood pressure. Requesting to go up on her medicines, previously on amlodipine 5, olmesartan 20, hydrochlorothiazide 25mg . Last took her medicines in January 2024 (6 months ago). Pt denies any symptoms at this time. BP elevated this visit, x3, last one being manual by me. Otherwise exam unremarkable.   -     Blood Pressure Monitor with Cuff- given in clinic   -     Urinalysis - Macroscopic w/ Reflex to Microscopic; Future - to look for protein  -     ECG 12 lead (MUSE) - baseline for pt with HTN, performed in clinic with tall QRS complex consistent with LVH. No ST changes. Non-specific TWI (III, V1, V2, V3). Left axis deviation. Otherwise sinus, with rate in high 60s.   -     amLODIPine (NORVASC) 10 MG tablet; Take 1 tablet (10 mg total) by mouth daily.  - Had labs ordered from visit in November, instructed to get today, if normal kidney function can start ARB or diuretic.   - RTC in 1 month

## 2023-04-27 NOTE — Patient Instructions (Signed)
Your blood pressure is elevated again today in clinic. We are starting amlodipine 10mg  once a day.     Be sure to get your lab work done today as well. If this looks ok we will likely start another medicine.     Call the office for new or worsening symptoms.

## 2023-05-08 ENCOUNTER — Ambulatory Visit: Payer: Medicaid (Managed Care) | Attending: Family

## 2023-05-18 ENCOUNTER — Ambulatory Visit: Payer: Medicaid (Managed Care) | Attending: Family

## 2023-05-18 ENCOUNTER — Ambulatory Visit: Payer: Medicaid (Managed Care)

## 2023-05-25 NOTE — Telephone Encounter (Signed)
Future Appointments   Date Time Provider Department Center   05/27/2023 10:40 AM Sampson Goon, MD [facility]       Last seen in office: 04/27/2023    No Known Allergies

## 2023-05-27 ENCOUNTER — Ambulatory Visit: Payer: Medicaid (Managed Care)

## 2023-05-27 NOTE — Progress Notes (Deleted)
[facility] - INTERNAL MEDICINE RESIDENT PRACTICE  Phone: [phone]    Name:  Alexandra [name]  Date of Birth: 1971/04/01   MRN: [id number]    Date of Service:  05/27/23    History of Present Illness:   Alexandra [name] is a 52 y.o. female with history of HTN and postmenopausal bleeding here for No chief complaint on file.      Last seen: 04/27/2023 Jerolyn Shin, MD for HTN    Today we discussed:  - HTN  - HM:      Health Maintenance Due   Topic Date Due    Immunization: Hepatitis B (1 of 3 - 19+ 3-dose series) Never done    Mammogram (MyChart)  Never done    Colorectal Cancer Screening (MyChart)  Never done    Immunization: Zoster (1 of 2) Never done    Comprehensive Physical Exam  10/03/2021    Diabetes Screening  05/10/2022    Renal Function/GFR  06/27/2022    Immunization: COVID-19 (2 - 2023-24 season) 07/04/2022      Objective:   LMP 05/03/2021 (Approximate)      Physical Exam    Assessment/Plan:     There are no diagnoses linked to this encounter.     No follow-ups on file.    Sampson Goon, MD  Internal Medicine, PGY - 2

## 2023-05-28 MED ORDER — amLODIPine (NORVASC) 10 MG tablet
10 | ORAL_TABLET | Freq: Every day | ORAL | 0 refills | Status: AC
Start: 2023-05-28 — End: 2023-05-28

## 2023-05-29 MED ORDER — amLODIPine (NORVASC) 10 MG tablet
10 | ORAL_TABLET | Freq: Every day | ORAL | 0 refills
Start: 2023-05-29 — End: 2023-06-22

## 2023-06-09 ENCOUNTER — Ambulatory Visit: Payer: Medicaid (Managed Care)

## 2023-06-22 MED ORDER — amLODIPine (NORVASC) 10 MG tablet
10 | ORAL_TABLET | Freq: Every day | ORAL | 0 refills | Status: AC
Start: 2023-06-22 — End: 2023-07-03

## 2023-07-03 ENCOUNTER — Ambulatory Visit: Admit: 2023-07-03 | Discharge: 2023-07-03 | Payer: Medicaid (Managed Care) | Attending: Family

## 2023-07-03 DIAGNOSIS — Z131 Encounter for screening for diabetes mellitus: Secondary | ICD-10-CM

## 2023-07-03 DIAGNOSIS — I10 Essential (primary) hypertension: Secondary | ICD-10-CM

## 2023-07-03 MED ORDER — amlodipine-valsartan (EXFORGE) 5-160 mg per tablet
5-160 | ORAL_TABLET | Freq: Every day | ORAL | 0 refills
Start: 2023-07-03 — End: 2023-09-21

## 2023-07-03 NOTE — Progress Notes (Signed)
[facility] - INTERNAL MEDICINE RESIDENT PRACTICE  Phone: [phone]    Name:  Alexandra [name]  Date of Birth: 1971-08-08(52 y.o.)    Subjective:     Chief Complaint   Patient presents with    Hypertension     She was restarted on amlodipine last visit  She takes it regularly at night  Recently lost her partner and is grieving and under stress  BP Readings from Last 3 Encounters:   07/03/23 (!) 159/93   04/27/23 (!) 182/118   10/01/22 146/89       Review of Systems    Current Outpatient Medications   Medication Sig    acetaminophen Take 2 tablets (1,000 mg total) by mouth every 8 hours as needed.    amLODIPine take 1 tablet by mouth daily     No current facility-administered medications for this visit.     Objective:   BP (!) 159/93   Pulse 67   Temp 97.3 F (36.3 C) (Temporal)   Ht 5' 2 (1.575 m)   Wt 203 lb 8 oz (92.3 kg)   LMP 05/03/2021 (Approximate)   SpO2 97%   BMI 37.22 kg/m     Physical Exam  Constitutional:       Appearance: Normal appearance.   HENT:      Head: Normocephalic and atraumatic.   Eyes:      Conjunctiva/sclera: Conjunctivae normal.   Neurological:      General: No focal deficit present.      Mental Status: She is alert and oriented to person, place, and time.   Psychiatric:         Mood and Affect: Mood normal.         Behavior: Behavior normal.         Thought Content: Thought content normal.         Judgment: Judgment normal.           Assessment/Plan     Problem List Items Addressed This Visit          Cardiovascular and Mediastinum    Primary hypertension     Change amlodipine 10 mg to combo amlodipine 5-valsartan 160 mg.   Check labs  See back for titration in 2 weeks         Relevant Medications    amlodipine-valsartan (EXFORGE) 5-160 mg per tablet    Other Relevant Orders    Renal Function Panel w/EGFR     Other Visit Diagnoses       Diabetes mellitus screening    -  Primary    Relevant Orders    Hemoglobin A1c    Lipid screening        Relevant Orders    Lipid Profile     Colon cancer screening        Relevant Orders    Colonoscopy, Open Access            No problem-specific Assessment & Plan notes found for this encounter.      No follow-ups on file.    Overdue Health Maintenance    We reviewed the following overdue health maintenance and preventive care.     Discussed mammogram. She was given number to schedule the appointment.       No LOS data to display    No LOS data to display

## 2023-07-03 NOTE — Patient Instructions (Signed)
You should get a mammogram every other year to screen for breast cancer. Please call [phone] to schedule    Call to schedule your colonoscopy [phone] with [facility].

## 2023-07-03 NOTE — Assessment & Plan Note (Signed)
Change amlodipine 10 mg to combo amlodipine 5-valsartan 160 mg.   Check labs  See back for titration in 2 weeks

## 2023-07-07 NOTE — Telephone Encounter (Signed)
 Called pt to schedule colo @[facility]  due to referral on file. Unable to reach pt, left detailed VM with my direct line stating to call back to schedule colo.

## 2023-07-17 ENCOUNTER — Ambulatory Visit: Payer: Medicaid (Managed Care) | Attending: Family

## 2023-07-24 ENCOUNTER — Ambulatory Visit: Payer: Medicaid (Managed Care) | Attending: Family

## 2023-09-14 NOTE — Progress Notes (Signed)
Community Health Work (CHW) Progress Note   Patient: Alexandra [name] MRN: [id number] DOB: 07/18/1971   Date: 09/14/2023   Type of Visit: Follow-Up (By Phone)     Intervention Summary   CHW contacted patient regarding SDOH screening, noting a 32% no-show rate. CHW provided open ended question, asking patient if there are any barriers she is currently experiencing regarding financial strain, food, housing, and/or transportation. Patient denied any barriers, stating that she will be coming to her appointment on 09/21/23. CHW invited patient to follow up with any questions/concerns/barriers that may arise in the future. No future follow up needed unless patient expresses additional needs.     Care Plan Updates   There are no care plans that you recently modified to display for this patient.       Upcoming Appointments   Future Appointments   Date Time Provider Department Center   09/21/2023  1:40 PM [name], [name] [facility]   10/05/2023  3:10 PM [facility] 3RD FLOOR [facility]        CHW Follow-Up   No subsequent follow-up scheduled unless patient expresses additional needs.      Oliva Bustard, [facility], BS  [facility] Worker II  [facility]  [facility] Internal Medicine - Resident Practice  [address] [place], [place] [address]  [phone]  [email or web address]

## 2023-09-21 ENCOUNTER — Ambulatory Visit: Admit: 2023-09-21 | Discharge: 2023-09-25 | Payer: PRIVATE HEALTH INSURANCE | Attending: Family

## 2023-09-21 ENCOUNTER — Other Ambulatory Visit: Admit: 2023-09-21 | Payer: PRIVATE HEALTH INSURANCE

## 2023-09-21 DIAGNOSIS — I10 Essential (primary) hypertension: Secondary | ICD-10-CM

## 2023-09-21 LAB — HEMOGLOBIN A1C: Hemoglobin A1C: 4.9 % (ref 4.0–5.6)

## 2023-09-21 LAB — LIPID PANEL
Cholesterol, Total: 192 mg/dL (ref 0–200)
HDL: 86 mg/dL (ref 60–92)
LDL Cholesterol: 86 mg/dL
Non-HDL Cholesterol, Calculated: 106 mg/dL (ref 0–129)
Triglycerides: 99 mg/dL (ref 10–149)

## 2023-09-21 LAB — RENAL FUNCTION PANEL W/EGFR
Albumin: 4 g/dL (ref 3.5–5.7)
Anion Gap: 8 mmol/L (ref 3–16)
BUN: 19 mg/dL (ref 7–25)
CO2: 26 mmol/L (ref 21–33)
Calcium: 9.4 mg/dL (ref 8.6–10.3)
Chloride: 109 mmol/L (ref 98–110)
Creatinine: 0.83 mg/dL (ref 0.60–1.30)
EGFR: 84
Glucose: 85 mg/dL (ref 70–100)
Osmolality, Calculated: 298 mosm/kg (ref 278–305)
Phosphorus: 3.6 mg/dL (ref 2.1–4.7)
Potassium: 4.3 mmol/L (ref 3.5–5.3)
Sodium: 143 mmol/L (ref 133–146)

## 2023-09-21 MED ORDER — amLODIPine (NORVASC) 10 MG tablet
10 | ORAL_TABLET | Freq: Every day | ORAL | 3 refills
Start: 2023-09-21 — End: 2023-11-06

## 2023-09-21 MED ORDER — valsartan (DIOVAN) 80 MG tablet
80 | ORAL_TABLET | Freq: Every day | ORAL | 1 refills
Start: 2023-09-21 — End: 2023-11-02

## 2023-09-21 NOTE — Assessment & Plan Note (Signed)
Restart meds- amlodipine 10 mg and valsartan 80 mg  Labs today  Follow-up in 2-4 weeks

## 2023-09-21 NOTE — Progress Notes (Signed)
 [facility] - INTERNAL MEDICINE RESIDENT PRACTICE  Phone: [phone]    Name:  Alexandra [name]  Date of Birth: [birth year](52 y.o.)    Subjective:     Chief Complaint   Patient presents with    Hypertension     Follow-up       She lost insurance so ha

## 2023-09-22 ENCOUNTER — Ambulatory Visit

## 2023-10-05 ENCOUNTER — Inpatient Hospital Stay: Payer: PRIVATE HEALTH INSURANCE | Attending: Student in an Organized Health Care Education/Training Program

## 2023-10-21 ENCOUNTER — Ambulatory Visit: Payer: PRIVATE HEALTH INSURANCE | Attending: Family

## 2023-11-02 ENCOUNTER — Ambulatory Visit: Admit: 2023-11-02 | Discharge: 2023-11-02 | Payer: PRIVATE HEALTH INSURANCE | Attending: Family

## 2023-11-02 DIAGNOSIS — I10 Essential (primary) hypertension: Secondary | ICD-10-CM

## 2023-11-02 MED ORDER — valsartan (DIOVAN) 80 MG tablet
80 | ORAL_TABLET | Freq: Every day | ORAL | 1 refills
Start: 2023-11-02 — End: 2023-12-02

## 2023-11-02 NOTE — Patient Instructions (Signed)
Call to schedule your colonoscopy [phone] with [facility].

## 2023-11-02 NOTE — Progress Notes (Signed)
[facility] - INTERNAL MEDICINE RESIDENT PRACTICE  Phone: [phone]    Name:  Alexandra [name]  Date of Birth: Apr 11, 1971(52 y.o.)    Subjective:     Chief Complaint   Patient presents with    Hypertension     She has run out of her valsartan but is taking amlodipine    BP Readings from Last 3 Encounters:   11/02/23 145/85   09/21/23 (!) 178/130   07/03/23 (!) 159/93     Mammogram scheduled          Review of Systems    Current Outpatient Medications   Medication Sig    amLODIPine Take 1 tablet (10 mg total) by mouth daily.    valsartan Take 1 tablet (80 mg total) by mouth daily.     No current facility-administered medications for this visit.     Objective:   BP 145/85 (BP Location: Left upper arm, Patient Position: Sitting, BP Cuff Size: Large)   Pulse 70   Temp 98.4 F (36.9 C) (Oral)   Resp 20   Ht 5' 2 (1.575 m)   Wt 207 lb 1.6 oz (93.9 kg)   LMP 05/03/2021 (Approximate)   SpO2 99%   BMI 37.88 kg/m     Physical Exam  Constitutional:       Appearance: Normal appearance.   HENT:      Head: Normocephalic and atraumatic.   Eyes:      Conjunctiva/sclera: Conjunctivae normal.   Neurological:      General: No focal deficit present.      Mental Status: She is alert and oriented to person, place, and time.   Psychiatric:         Mood and Affect: Mood normal.         Behavior: Behavior normal.         Thought Content: Thought content normal.         Judgment: Judgment normal.           Assessment/Plan     Problem List Items Addressed This Visit          Cardiovascular and Mediastinum    Primary hypertension - Primary     Elevated slightly but out of valsartan. Refill it and continue amlodipine. Check BP in 3 weeks.          Relevant Medications    valsartan (DIOVAN) 80 MG tablet       No problem-specific Assessment & Plan notes found for this encounter.      Return in about 3 weeks (around 11/23/2023) for Blood Pressure Follow up.    Overdue Health Maintenance    We reviewed the following overdue health  maintenance and preventive care.     Discussed colon cancer screening. They would like colonoscopy. Referral placed and patient given the number to schedule.     We reviewed the following recommended vaccinations:        No LOS data to display    No LOS data to display

## 2023-11-02 NOTE — Assessment & Plan Note (Signed)
Elevated slightly but out of valsartan. Refill it and continue amlodipine. Check BP in 3 weeks.

## 2023-11-05 NOTE — Telephone Encounter (Signed)
Last visit 11/02/2023 Alexandra Canterbury, [name]    Requested Prescriptions     Pending Prescriptions Disp Refills    amlodipine-valsartan (EXFORGE) 5-160 mg per tablet [Pharmacy Med Name: amLODIPine-VALSARTAN 5-160 MG TAB] 30 tablet 0     Sig: TAKE 1 TABLET BY MOUTH DAILY       No Known Drug Allergies or Adverse Reactions      Future Appointments   Date Time Provider Department Center   11/19/2023  2:30 PM [facility] SCR MAM 1 [facility]   11/26/2023  1:00 PM Parks Neptune, DO [facility]     Forward to PCP for review and authorization if appropriate. Please verify qty and refills.

## 2023-11-06 MED ORDER — amLODIPine (NORVASC) 10 MG tablet
10 | ORAL_TABLET | Freq: Every day | ORAL | 3 refills | Status: AC
Start: 2023-11-06 — End: ?

## 2023-11-19 ENCOUNTER — Inpatient Hospital Stay
Admit: 2023-11-19 | Discharge: 2023-11-27 | Payer: PRIVATE HEALTH INSURANCE | Attending: Student in an Organized Health Care Education/Training Program

## 2023-11-19 DIAGNOSIS — Z1231 Encounter for screening mammogram for malignant neoplasm of breast: Secondary | ICD-10-CM

## 2023-11-19 DIAGNOSIS — Z1239 Encounter for other screening for malignant neoplasm of breast: Secondary | ICD-10-CM

## 2023-11-26 ENCOUNTER — Ambulatory Visit: Payer: PRIVATE HEALTH INSURANCE

## 2023-12-02 ENCOUNTER — Ambulatory Visit: Admit: 2023-12-02 | Discharge: 2023-12-02 | Payer: PRIVATE HEALTH INSURANCE | Attending: Family

## 2023-12-02 DIAGNOSIS — I10 Essential (primary) hypertension: Secondary | ICD-10-CM

## 2023-12-02 MED ORDER — naproxen (NAPROSYN) 250 MG tablet
250 | ORAL_TABLET | Freq: Two times a day (BID) | ORAL | 1 refills | Status: AC | PRN
Start: 2023-12-02 — End: ?

## 2023-12-02 MED ORDER — valsartan (DIOVAN) 160 MG tablet
160 | ORAL_TABLET | Freq: Every day | ORAL | 1 refills | Status: AC
Start: 2023-12-02 — End: 2023-12-24

## 2023-12-02 NOTE — Progress Notes (Signed)
 [facility] - INTERNAL MEDICINE RESIDENT PRACTICE  Phone: [phone]    Name:  Alexandra [name]  Date of Birth: 1971/03/22(52 y.o.)    Subjective:     Chief Complaint   Patient presents with    Hypertension     Pt states that she is here for a blood pressure check.      She was out of valsartan  80 mg last visit.   Restarted it and now back for follow up  No side effects.     BP Readings from Last 3 Encounters:   12/02/23 132/83   11/02/23 145/85   09/21/23 (!) 178/130             Review of Systems    Current Outpatient Medications   Medication Sig    amLODIPine  Take 1 tablet (10 mg total) by mouth daily.    valsartan  Take 1 tablet (80 mg total) by mouth daily.     No current facility-administered medications for this visit.     Objective:   BP 132/83 (BP Location: Right upper arm, Patient Position: Sitting, BP Cuff Size: Regular)   Pulse 66   Temp 97.3 F (36.3 C) (Temporal)   Resp 16   Ht 5' 2 (1.575 m)   Wt 211 lb (95.7 kg)   LMP 05/03/2021 (Approximate)   SpO2 99%   BMI 38.59 kg/m     Physical Exam  Vitals reviewed.   Constitutional:       Appearance: Normal appearance.   HENT:      Head: Normocephalic and atraumatic.   Cardiovascular:      Rate and Rhythm: Normal rate and regular rhythm.      Heart sounds: Normal heart sounds. No murmur heard.  Pulmonary:      Breath sounds: Normal breath sounds.   Musculoskeletal:      Right lower leg: No edema.      Left lower leg: No edema.   Skin:     General: Skin is warm and dry.   Neurological:      General: No focal deficit present.      Mental Status: She is alert.   Psychiatric:         Mood and Affect: Mood normal.         Behavior: Behavior normal.         Thought Content: Thought content normal.         Judgment: Judgment normal.           Assessment/Plan     Tonee was seen today for hypertension.    Diagnoses and all orders for this visit:    Primary hypertension (Primary)  Assessment & Plan:  BP borderline  Increase valsartan  to 160 mg   Continue  amlodipine       Other orders  -     naproxen  (NAPROSYN ) 250 MG tablet; Take 1 tablet (250 mg total) by mouth 2 times a day as needed for Pain.  -     valsartan  (DIOVAN ) 160 MG tablet; Take 1 tablet (160 mg total) by mouth daily.         Return in about 1 month (around 01/01/2024) for Blood Pressure Follow up.      Overdue Health Maintenance    We reviewed the following overdue health maintenance and preventive care.         We reviewed the following recommended vaccinations:        No LOS data to display  No LOS data to display

## 2023-12-02 NOTE — Patient Instructions (Signed)
 Call to schedule your colonoscopy [phone] with [facility].

## 2023-12-08 NOTE — Telephone Encounter (Signed)
 Called pharmacy and confirmed Naproxen and Valsartan prescriptions were received and they told me they are ready for pick up.     Message sent to patient to make her aware.

## 2023-12-08 NOTE — Assessment & Plan Note (Signed)
 BP borderline  Increase valsartan to 160 mg   Continue amlodipine

## 2023-12-22 NOTE — Telephone Encounter (Signed)
 Pt called to schedule a colo but it looks like her referral is set to expire on 12/30/23. Pt stated she wanted to schedule something in April and informed her she will have to get an updated referral from PCP. Pt understood.

## 2023-12-24 ENCOUNTER — Ambulatory Visit: Admit: 2023-12-24 | Discharge: 2023-12-24 | Payer: PRIVATE HEALTH INSURANCE | Attending: Family

## 2023-12-24 DIAGNOSIS — N390 Urinary tract infection, site not specified: Secondary | ICD-10-CM

## 2023-12-24 LAB — POC URINALYSIS
Bilirubin, UA: NEGATIVE
Blood, POC, UA: NEGATIVE
Glucose, POC, UA: NEGATIVE mg/dL
Ketones, POC, UA: NEGATIVE mg/dL
Leukocytes, POC, UA: NEGATIVE
Nitrite, POC, UA: NEGATIVE
Protein, POC, UA: NEGATIVE mg/dL
Spec Grav, UA: 1.02 (ref 1.005–1.035)
Urobilinogen, POC, UA: 1 mg/dL (ref 0.2–1.0)
pH, POC,UA: 6.5 (ref 5.0–8.0)

## 2023-12-24 MED ORDER — valsartan (DIOVAN) 320 MG tablet
320 | ORAL_TABLET | Freq: Every day | ORAL | 1 refills | Status: AC
Start: 2023-12-24 — End: ?

## 2023-12-24 NOTE — Progress Notes (Signed)
 [facility] - INTERNAL MEDICINE RESIDENT PRACTICE  Phone: [phone]    Name:  Alexandra [name]  Date of Birth: 1971/01/26(52 y.o.)    Subjective:     Chief Complaint   Patient presents with    Hospital Discharge Follow Up     S/P Urgent Care Visit for UTI     Hypertension- last visit increase valsartan to 160 mg  BP Readings from Last 3 Encounters:   12/24/23 137/82   12/02/23 132/83   11/02/23 145/85     She was at urgent care for UTI and given Macrobid  Her culture showed Ecoli and was wondering where she got it  UA is clear today        Review of Systems    Current Outpatient Medications   Medication Sig    amLODIPine Take 1 tablet (10 mg total) by mouth daily.    naproxen Take 1 tablet (250 mg total) by mouth 2 times a day as needed for Pain.    valsartan Take 1 tablet (320 mg total) by mouth daily.    polyethylene glycol Please take your prep according to the instructions outlined in your prep letter in messages and communications.     No current facility-administered medications for this visit.     Objective:   BP 137/82 (BP Location: Left upper arm, Patient Position: Sitting, BP Cuff Size: Large)   Pulse 69   Temp 97.7 F (36.5 C) (Oral)   Resp 20   Ht 5' 2 (1.575 m)   Wt 208 lb (94.3 kg)   LMP 05/03/2021 (Approximate)   SpO2 97%   BMI 38.04 kg/m     Physical Exam  Constitutional:       Appearance: Normal appearance.   HENT:      Head: Normocephalic and atraumatic.   Eyes:      Conjunctiva/sclera: Conjunctivae normal.   Neurological:      General: No focal deficit present.      Mental Status: She is alert and oriented to person, place, and time.   Psychiatric:         Mood and Affect: Mood normal.         Behavior: Behavior normal.         Thought Content: Thought content normal.         Judgment: Judgment normal.           Assessment/Plan     Harmony was seen today for hospital discharge follow up.    Diagnoses and all orders for this visit:    Primary hypertension (Primary)  Assessment &  Plan:  Increase valsartan to 320 mg  Follow up in 1 month     Orders:  -     valsartan (DIOVAN) 320 MG tablet; Take 1 tablet (320 mg total) by mouth daily.    Urinary tract infection without hematuria, site unspecified  -     Urinalysis (POC)  -     POC Urinalysis    Colon cancer screening  -     Colonoscopy, Open Access         Return in about 6 weeks (around 02/04/2024) for Blood Pressure Follow up= please push back appt until April.      Overdue Health Maintenance    We reviewed the following overdue health maintenance and preventive care.         We reviewed the following recommended vaccinations:        No LOS data to display  No LOS data to display

## 2023-12-24 NOTE — Progress Notes (Signed)
POC UA performed per order, see results per chart.

## 2023-12-29 MED ORDER — polyethylene glycol (GOLYTELY) 236-22.74-6.74 -5.86 gram solution
236-22.74-6.74 | ORAL | 0 refills | 14.00000 days | Status: AC
Start: 2023-12-29 — End: ?

## 2023-12-29 NOTE — Assessment & Plan Note (Signed)
 Increase valsartan to 320 mg  Follow up in 1 month

## 2023-12-29 NOTE — Telephone Encounter (Signed)
 After chart review, patient is classified as ELIGIBLE to be scheduled at either [facility] before scheduling the procedure), [facility], or [facility].    1) Age: 53 y.o.  2) BMI: (BMI > 50 patient needs OV)  3) Patient has been seen by a [facility] provider (MD, [name], PA) in the last 3 years?  No  4) Medications:   A) Antiplatelet or Anticoagulants?    No,    B) GLP-1?     No,  > please orient patient to hold the medication for 7 days prior the procedure.   C) Oral anti-diabetic?     No,  > please orient patient to not take the medication on the morning of the procedure. They need to talk with their PCP/endocrinologist about diabetes medication management with bowel prep/NPO.  5) Allergies:  No Known Drug Allergies or Adverse Reactions  6) Drug Abuse   No,   7) Gastroenterology  A) Any symptoms or diagnosis?  No,   8) Cardiovascular  A) Any symptoms or diagnosis?   Yes, Hypertension  B) Last office visit's date:   9) Pulmonology  A) Any symptoms or diagnosis?  No,   B) Last office visit's date:  10) Endocrine  A) Any symptoms or diagnosis?  No,   B) Last office visit's date:  3) GYN/GU/Renal  A) Any symptoms or diagnosis?  No,   B) Last office visit's date:  62) Hematology/Oncology  A) Any symptoms or diagnosis?  No,   B) Last office visit's date:  65) Neuromuscular  A) Any symptoms or diagnosis?  No,   B) Last office visit's date:  47) Anesthesia Events   No  15) Surgical History  Past Surgical History:   Procedure Laterality Date    CESAREAN SECTION  11/03/1989    CYST REMOVAL      SHOULDER ARTHROSCOPY Right 07/25/2021    Procedure: RIGHT ARTHROSCOPY SHOULDER , SUBACROMIAL DECOMPRESSION , ROTATOR CUFF REPAIR;  Surgeon: Gayla Medicus, MD;  Location: [facility];  Service: Orthopedics;  Laterality: Right;    TUBAL LIGATION  11/03/1994

## 2024-01-04 ENCOUNTER — Ambulatory Visit: Payer: PRIVATE HEALTH INSURANCE

## 2024-02-09 NOTE — Progress Notes (Signed)
 Vm left, patient called to confirm procedure on 02/19/24, message included arrival time, need for escort and direct line for return call

## 2024-02-19 ENCOUNTER — Ambulatory Visit: Payer: PRIVATE HEALTH INSURANCE

## 2024-02-19 ENCOUNTER — Encounter

## 2024-04-18 ENCOUNTER — Ambulatory Visit: Payer: PRIVATE HEALTH INSURANCE | Attending: Family

## 2024-06-22 MED ORDER — amLODIPine (NORVASC) 10 MG tablet
10 | ORAL_TABLET | Freq: Every day | ORAL | 3 refills | 30.00000 days
Start: 2024-06-22 — End: 2024-07-18

## 2024-06-22 MED ORDER — valsartan (DIOVAN) 320 MG tablet
320 | ORAL_TABLET | Freq: Every day | ORAL | 1 refills | 30.00000 days
Start: 2024-06-22 — End: 2024-07-18

## 2024-06-22 NOTE — Telephone Encounter (Signed)
 Lorenza DELENA Sharps is requesting refill on medication. Medication order is currently pending. Please review order. Thanks!    Requested Prescriptions     Pending Prescriptions Disp Refills    amLODIPine  (NORVASC ) 10 MG tablet 90 tablet 3     Sig: Take 1 tablet (10 mg total) by mouth daily.       No future appointments.    LAST OV:   Visit date not found

## 2024-06-22 NOTE — Telephone Encounter (Signed)
 Alexandra [name] is requesting refill on medication. Medication order is currently pending. Please review order. Thanks!    Requested Prescriptions     Pending Prescriptions Disp Refills    naproxen  (NAPROSYN ) 250 MG tablet 60 tablet 1     Sig: Take 1 tablet (250 mg total) by mouth 2 times a day as needed for Pain.    valsartan  (DIOVAN ) 320 MG tablet 90 tablet 1     Sig: Take 1 tablet (320 mg total) by mouth daily.       No future appointments.    LAST OV:   04/18/2024 [name], [name]

## 2024-06-28 NOTE — Telephone Encounter (Signed)
 Placed call to patient to report RX was sent 8/20 per request. Patient verbalized understanding.

## 2024-07-18 ENCOUNTER — Ambulatory Visit: Admit: 2024-07-18 | Discharge: 2024-07-18 | Payer: PRIVATE HEALTH INSURANCE | Attending: Family

## 2024-07-18 DIAGNOSIS — I10 Essential (primary) hypertension: Principal | ICD-10-CM

## 2024-07-18 MED ORDER — MELOXICAM 7.5 MG TABLET
7.5 | ORAL_TABLET | Freq: Every day | ORAL | 0 refills | 30.00000 days
Start: 2024-07-18 — End: 2024-08-30

## 2024-07-18 MED ORDER — valsartan-hydrochlorothiazide (DIOVAN-HCT) 320-12.5 mg per tablet
320-12.5 | ORAL_TABLET | Freq: Every day | ORAL | 0 refills | 90.00000 days | Status: AC
Start: 2024-07-18 — End: 2024-07-19

## 2024-07-18 MED ORDER — amLODIPine (NORVASC) 10 MG tablet
10 | ORAL_TABLET | Freq: Every day | ORAL | 3 refills | 30.00000 days | Status: AC
Start: 2024-07-18 — End: ?

## 2024-07-18 NOTE — Patient Instructions (Signed)
 Start the hydrochlorothiazide  for blood pressure

## 2024-07-18 NOTE — Progress Notes (Signed)
 [facility] - INTERNAL MEDICINE RESIDENT PRACTICE  Phone: [phone]    Name:  Alexandra [name]  Date of Birth: Dec 21, 1970(53 y.o.)    Subjective:     Chief Complaint   Patient presents with    Hypertension     Orthopedics referral       Last visit in clinic was 12/24/2023 Alexandra JONETTA Chancy, [name]     History of Present Illness  Alexandra [name] is a 53 year old female with hypertension who presents for blood pressure management and heel pain.    Hypertension management  - Consistently takes valsartan  and amlodipine  for blood pressure control.  - Previously discontinued hydrochlorothiazide  due to increased urination; last used in 2023 and not restarted after running out of medication.  - Previously discontinued nifedipine  due to headaches.    Heel pain  - Heel pain present for approximately one month.  - Pain is localized to the heel and is persistent.  - Pain is severe enough to require walking on her toes.  - Pain is worse when standing without shoes and does not improve with walking.  - Onset of pain followed use of inexpensive, flat gym shoes for a couple of weeks.  - Works at [employer], requiring frequent standing and walking.  - Uses only occasional ibuprofen  for pain relief; no consistent medication use.            Review of Systems    Current Outpatient Medications   Medication Sig    amLODIPine  Take 1 tablet (10 mg total) by mouth daily.    naproxen  Take 1 tablet (250 mg total) by mouth 2 times a day as needed for Pain.    polyethylene glycol Please take your prep according to the instructions outlined in your prep letter in messages and communications.    valsartan  Take 1 tablet (320 mg total) by mouth daily.     No current facility-administered medications for this visit.     Objective:   BP (!) 166/102 (BP Location: Left upper arm, Patient Position: Sitting, BP Cuff Size: Regular)   Pulse 64   Wt 214 lb (97.1 kg)   LMP 05/03/2021 (Approximate)   SpO2 99%   BMI 39.14 kg/m     Physical  Exam  Vitals reviewed.   Constitutional:       Appearance: Normal appearance.   HENT:      Head: Normocephalic and atraumatic.   Eyes:      Conjunctiva/sclera: Conjunctivae normal.   Cardiovascular:      Rate and Rhythm: Normal rate and regular rhythm.      Pulses:           Dorsalis pedis pulses are 2+ on the right side and 2+ on the left side.        Posterior tibial pulses are 2+ on the right side and 2+ on the left side.   Pulmonary:      Effort: Pulmonary effort is normal.      Breath sounds: Normal breath sounds.   Musculoskeletal:      Right lower leg: No edema.      Left lower leg: No edema.      Right ankle: Normal.      Right foot: Bony tenderness (right heel) present. No swelling or tenderness.   Feet:      Right foot:      Protective Sensation: 6 sites tested.  6 sites sensed.      Skin integrity: Skin  integrity normal.      Toenail Condition: Right toenails are normal.      Left foot:      Protective Sensation: 6 sites tested.  6 sites sensed.      Skin integrity: Skin integrity normal.      Toenail Condition: Left toenails are normal.   Neurological:      Mental Status: She is alert and oriented to person, place, and time.   Psychiatric:         Mood and Affect: Mood normal.         Behavior: Behavior normal.         Thought Content: Thought content normal.               Assessment/Plan     Assessment & Plan  Primary hypertension  Add hydrochlorothiazide  to valsartan  320 mg in form combo pilld  Continue amlodipine  10 mg  I checked her [place] formulary -not triple combo pills covered but the above one is Tier 2 same as meds alone  Labs  Recheck blood pressure 1 month  Orders:    Aldosterone/Renin Ratio; Future    Renal Function Panel w/EGFR; Future    amLODIPine  (NORVASC ) 10 MG tablet; Take 1 tablet (10 mg total) by mouth daily.    valsartan -hydrochlorothiazide  (DIOVAN -HCT) 320-12.5 mg per tablet; Take 1 tablet by mouth daily.    Diabetes mellitus screening    Orders:    Hemoglobin A1c;  Future    Plantar fasciitis of right foot  Education   Supportive shoes even indoors  Reviewed exercices  Orders:    meloxicam  (MOBIC ) 7.5 MG tablet; Take 1 tablet (7.5 mg total) by mouth daily.    Podiatry Referral    X-ray Foot Right 2-views; Future    Colon cancer screening    Orders:    Screening/Open Access Colonoscopy          No follow-ups on file.      Overdue Health Maintenance    We reviewed the following overdue health maintenance and preventive care.         We reviewed the following recommended vaccinations:        No LOS data to display    No LOS data to display

## 2024-07-18 NOTE — Assessment & Plan Note (Addendum)
 Add hydrochlorothiazide  to valsartan  320 mg in form combo pilld  Continue amlodipine  10 mg  I checked her [place] formulary -not triple combo pills covered but the above one is Tier 2 same as meds alone  Labs  Recheck blood pressure 1 month  Orders:    Aldosterone/Renin Ratio; Future    Renal Function Panel w/EGFR; Future    amLODIPine  (NORVASC ) 10 MG tablet; Take 1 tablet (10 mg total) by mouth daily.    valsartan -hydrochlorothiazide  (DIOVAN -HCT) 320-12.5 mg per tablet; Take 1 tablet by mouth daily.

## 2024-07-19 MED ORDER — valsartan-hydrochlorothiazide (DIOVAN-HCT) 320-12.5 mg per tablet
320-12.5 | ORAL_TABLET | Freq: Every day | ORAL | 2 refills | 90.00000 days | Status: AC
Start: 2024-07-19 — End: ?

## 2024-07-19 NOTE — Telephone Encounter (Signed)
 This is for a prior authorization.     Alexandra [name] is requesting refill on medication. Medication order is currently pending. Please review order. Thanks!    Requested Prescriptions     Pending Prescriptions Disp Refills    valsartan -hydrochlorothiazide  (DIOVAN -HCT) 320-12.5 mg per tablet 90 tablet 0     Sig: Take 1 tablet by mouth daily.       Future Appointments   Date Time Provider Department Center   08/16/2024  1:00 PM Fairy Pitts, DO [facility]       LAST OV: 07/18/2024

## 2024-07-25 NOTE — Plan of Care (Signed)
 [facility] Ambulatory Nurse Care Coordination      Reason for Today's Outreach: Enrollment/Initial Contact  Preferred Contact/Time: [phone]    Reviewed patients chart, patient meets criteria for care management sent message via MyChart to discuss enrollment.       ADORA KELLIE FOX, RN  Primary Care - Care Manager

## 2024-07-28 MED ORDER — hydroCHLOROthiazideMICROZIDE125mgcapsule
12.5 | ORAL_CAPSULE | Freq: Every day | ORAL | 2 refills | 30.00000 days | Status: AC
Start: 2024-07-28 — End: ?

## 2024-07-28 MED ORDER — valsartanDIOVAN320MGtablet
320 | ORAL_TABLET | Freq: Every day | ORAL | 2 refills | 30.00000 days | Status: AC
Start: 2024-07-28 — End: ?

## 2024-08-04 ENCOUNTER — Inpatient Hospital Stay: Admit: 2024-08-04 | Discharge: 2024-08-15 | Payer: PRIVATE HEALTH INSURANCE

## 2024-08-04 ENCOUNTER — Ambulatory Visit: Admit: 2024-08-04 | Discharge: 2024-08-04 | Payer: PRIVATE HEALTH INSURANCE

## 2024-08-04 DIAGNOSIS — M722 Plantar fascial fibromatosis: Principal | ICD-10-CM

## 2024-08-04 MED ORDER — METHYLPREDNISOLONE 4 MG TABLETS IN A DOSE PACK
4 | ORAL | 0 refills | 6.00000 days
Start: 2024-08-04 — End: 2024-08-30

## 2024-08-04 NOTE — Progress Notes (Signed)
 FOOT [facility]KLE SURGERY CLINIC NOTE    08/04/2024               2:35 PM  Patient:Alexandra [name]    Chief Complaint: Right Heel Pain    HPI:  Patient presents today complaining of right heel pain which is located primarily on the inside and anterior portion of the heel. Patient relates intermittent radiating pain into the medial arch and to the rest of the heel pad and has been increasing in intensity recently. Patient relates that this is especially painful after waking in the morning when first arising, and after periods of sitting around. Patient relates that they have tried OTC pain relievers and shoe gear modification to no avail. Patient relates that this has begun to hinder their lifestyle and activity levels including those at work, and wishes to have something done about the problem. No trauma or incident remembered.    Review of Systems:  General: denies fevers/chills, night sweats  Ophthalmic: denies changes in vision, loss of vision  Respiratory: denies cough, shortness of breath  Cardiovascular: denies chest pain, irregular heartbeat and palpitations  Gastrointestinal: denies abdominal pain, constipation, diarrhea, nausea/vomiting   Genitourinary: denies dysuria, hematuria  Musculoskeletal: positive arch and heel pain, denies joint pain, joint stiffness  Neurological: denies headaches, numbness/tingling  Dermatological: denies dry skin, rash       Active Problems:    * No active hospital problems. *    Past Medical History:   Diagnosis Date    Hypertension     Menarche     Age 89    STD (sexually transmitted disease)     history of GC/trichomonas as teen     Past Surgical History:   Procedure Laterality Date    CESAREAN SECTION  11/03/1989    CYST REMOVAL      SHOULDER ARTHROSCOPY Right 07/25/2021    Procedure: RIGHT ARTHROSCOPY SHOULDER , SUBACROMIAL DECOMPRESSION , ROTATOR CUFF REPAIR;  Surgeon: Lonni Glen, MD;  Location: [facility];  Service: Orthopedics;  Laterality: Right;    TUBAL LIGATION   11/03/1994     Family History   Problem Relation Age of Onset    Diabetes Father         NIDDM    Diabetes Paternal Grandmother         IDDM    Breast Cancer Neg Hx     Colon Cancer Neg Hx     Ovarian cancer Neg Hx      Social History[1]  Allergies[2]      Objective:  Height 5' 2 (1.575 m), weight 217 lb 12.8 oz (98.8 kg), last menstrual period 05/03/2021.    Orthopedic:  Extrinsic and intrinsic musculature of the foot are grossly normal with 5/5 muscle strength to the foot and ankle.  Negative pain to passive or active range of motion bilaterally and free of overt joint crepitation.   Positive limitation to dorsiflexion at ankle joint bilaterally with soft end-ROM which is largely alleviated with unlocking of knee joint during exam.   Tenderness to direct palpation of the plantar medial heel at the insertion/origin of the plantar fascia.  Tenderness along the course of the plantar fascia.  Tenderness on activation of the windlass mechanism.    Vascular:  Pulses palpable +2/4 DP & PT Bilaterally.  Capillary fill time brisk, approximately 3 seconds bilateral toes.  Negative for significant edema in either lower extremity.  Skin temp warm to cool proximal to distal bilaterally.    Neurological:  Gross, protective, and epicritic sensation intact bilaterally.  Achilles and Patellar reflexes within normal limits 2/4 bilateral  Negative babinski with hallux downgoing.  Negative tinels/vallieux upon neural light percussion.    Dermatologic:  Skin intact in total  Negative for overt rashes or irregular pigmented lesions.  Skin turgor normal.  No erythema, ecchymosis, macerations or open lesions.    Imaging:  Radiographs right foot: with plantar/posterior calcaneal spurring. Negative for fracture/dislocation. Negative for acute osseous abnormality.    Assessment:  Plantar fasciitis right foot  Contracture of achilles tendon right  Pain in right foot    Treatment:  Evaluation and managemenmt discussion was extensive with  greater than 50% of time spent with patient dedicated to discussion of pathogenesis and treatment options for patient's plantar fascial symptoms including doing nothing, orthotics, stretching, injection therapy, NSAIDS, RICE, immobilization, and surgical options such as plantar fasciotomy, plantar fasciectomy, gastrocnemius recession, or Achilles Tendon lengthening.  Radiographs independently reviewed.   Patient educated on and detailed handout dispensed on stretching, orthotic use, NSAID, RICE therapy, and other modalities  Rx: Medrol dose pak  Discussed physical therapy - patient declined.   Reappoint 6 weeks or sooner if needed.           [1]   Social History  Tobacco Use    Smoking status: Never    Smokeless tobacco: Never   Vaping Use    Vaping status: Never Used   Substance Use Topics    Alcohol use: No    Drug use: Not Currently     Types: Marijuana   [2] No Known Drug Allergies or Adverse Reactions

## 2024-08-07 DIAGNOSIS — M722 Plantar fascial fibromatosis: Secondary | ICD-10-CM

## 2024-08-16 ENCOUNTER — Ambulatory Visit: Payer: PRIVATE HEALTH INSURANCE

## 2024-08-31 MED ORDER — METHYLPREDNISOLONE 4 MG TABLETS IN A DOSE PACK
4 | ORAL | 0 refills | 6.00000 days | Status: AC
Start: 2024-08-31 — End: ?

## 2024-08-31 NOTE — Telephone Encounter (Signed)
 Alexandra [name] is requesting refill on medication. Medication order is currently pending. Please review order. Thanks!    Requested Prescriptions     Pending Prescriptions Disp Refills    meloxicam  (MOBIC ) 7.5 MG tablet 15 tablet 0     Sig: Take 1 tablet (7.5 mg total) by mouth daily.       Future Appointments   Date Time Provider Department Center   09/12/2024  1:30 PM [name], [name] [facility]   09/15/2024 10:45 AM Ozell Friedman, DPM [facility]       LAST OV: 07/18/2024

## 2024-09-01 MED ORDER — MELOXICAM 7.5 MG TABLET
7.5 | ORAL_TABLET | Freq: Every day | ORAL | 0 refills | Status: AC
Start: 2024-09-01 — End: ?

## 2024-09-12 ENCOUNTER — Ambulatory Visit: Payer: PRIVATE HEALTH INSURANCE | Attending: Family

## 2024-09-15 ENCOUNTER — Ambulatory Visit: Payer: PRIVATE HEALTH INSURANCE

## 2024-11-09 NOTE — Telephone Encounter (Signed)
 Left vm to callback and setup colo. Sending letter and MyChart message.
